# Patient Record
Sex: Female | Born: 1970 | Race: White | Hispanic: No | Marital: Married | Smoking: Never smoker
Health system: Southern US, Community
[De-identification: ages and names within clinical notes are randomized; demographics above are authoritative.]

## PROBLEM LIST (undated history)

## (undated) DIAGNOSIS — F419 Anxiety disorder, unspecified: Secondary | ICD-10-CM

## (undated) DIAGNOSIS — Z789 Other specified health status: Secondary | ICD-10-CM

## (undated) DIAGNOSIS — I8393 Asymptomatic varicose veins of bilateral lower extremities: Secondary | ICD-10-CM

## (undated) DIAGNOSIS — I10 Essential (primary) hypertension: Secondary | ICD-10-CM

## (undated) HISTORY — DX: Asymptomatic varicose veins of bilateral lower extremities: I83.93

## (undated) HISTORY — PX: TONSILLECTOMY: SUR1361

## (undated) HISTORY — DX: Other specified health status: Z78.9

---

## 2004-08-13 ENCOUNTER — Ambulatory Visit: Payer: Self-pay | Admitting: General Surgery

## 2009-01-15 ENCOUNTER — Ambulatory Visit: Payer: Self-pay | Admitting: Neurology

## 2009-07-13 ENCOUNTER — Ambulatory Visit: Payer: Self-pay | Admitting: Neurology

## 2011-11-03 ENCOUNTER — Ambulatory Visit: Payer: Self-pay | Admitting: Internal Medicine

## 2012-01-31 ENCOUNTER — Emergency Department: Payer: Self-pay | Admitting: Internal Medicine

## 2012-01-31 LAB — BASIC METABOLIC PANEL
Anion Gap: 7 (ref 7–16)
BUN: 17 mg/dL (ref 7–18)
Calcium, Total: 8.8 mg/dL (ref 8.5–10.1)
Chloride: 104 mmol/L (ref 98–107)
Co2: 27 mmol/L (ref 21–32)
EGFR (Non-African Amer.): >60
Glucose: 92 mg/dL (ref 65–99)
Potassium: 3.8 mmol/L (ref 3.5–5.1)

## 2012-01-31 LAB — URINALYSIS, COMPLETE
Bacteria: NONE SEEN
Bilirubin,UR: NEGATIVE
Glucose,UR: NEGATIVE mg/dL (ref 0–75)
Leukocyte Esterase: NEGATIVE
Nitrite: NEGATIVE
Ph: 8 (ref 4.5–8.0)
Specific Gravity: 1.018 (ref 1.003–1.030)
WBC UR: <1 /HPF (ref 0–5)

## 2012-01-31 LAB — CBC
MCV: 95 fL (ref 80–100)
RBC: 4.53 10*6/uL (ref 3.80–5.20)
RDW: 12 % (ref 11.5–14.5)

## 2012-01-31 LAB — PREGNANCY, URINE: Pregnancy Test, Urine: NEGATIVE m[IU]/mL

## 2013-09-12 ENCOUNTER — Ambulatory Visit: Payer: Self-pay | Admitting: Rheumatology

## 2017-10-15 ENCOUNTER — Ambulatory Visit (INDEPENDENT_AMBULATORY_CARE_PROVIDER_SITE_OTHER): Payer: Managed Care, Other (non HMO) | Admitting: Vascular Surgery

## 2017-10-15 ENCOUNTER — Encounter (INDEPENDENT_AMBULATORY_CARE_PROVIDER_SITE_OTHER): Payer: Self-pay | Admitting: Vascular Surgery

## 2017-10-15 VITALS — BP 150/94 | HR 68 | Resp 17 | Ht 63.0 in | Wt 163.6 lb

## 2017-10-15 DIAGNOSIS — R600 Localized edema: Secondary | ICD-10-CM

## 2017-10-15 DIAGNOSIS — I83813 Varicose veins of bilateral lower extremities with pain: Secondary | ICD-10-CM | POA: Diagnosis not present

## 2017-10-15 NOTE — Progress Notes (Signed)
Subjective:    Patient ID: Chelsea Greene, female    DOB: 1971-05-22, 47 y.o.   MRN: [id number] Chief Complaint  Patient presents with  . New Patient (Initial Visit)    ref self for bil leg soreness   Presents as a new patient self-referred for evaluation of painful varicose veins.  The patient notes a month of progressively worsening pain along a large varicosity noted to the back both of her legs.  The patient endorses a history of worsening pain and tenderness to these veins especially with prolonged activity, sitting and standing for long periods of time and towards the end of the day.  The patient feels her symptoms have progressed to the point she is unable to function on a daily basis which prompted her to seek medical attention.  The patient denies any recent surgery or trauma to the bilateral lower extremity.  At this time, the patient does not engage in conservative therapy including wearing medical grade one compression stockings or elevating her legs on a daily basis.  The patient denies any claudication-like symptoms, rest pain or ulceration to the bilateral lower extremity.  The patient describes her discomfort as a throbbing and aching along the varicosities.  The patient denies any fever, nausea or vomiting.   Review of Systems  Constitutional: Negative.   HENT: Negative.   Eyes: Negative.   Respiratory: Negative.   Cardiovascular: Positive for leg swelling.       Painful varicose veins  Gastrointestinal: Negative.   Endocrine: Negative.   Genitourinary: Negative.   Musculoskeletal: Negative.   Skin: Negative.   Allergic/Immunologic: Negative.   Neurological: Negative.   Hematological: Negative.   Psychiatric/Behavioral: Negative.       Objective:   Physical Exam  Constitutional: She is oriented to person, place, and time. She appears well-developed and well-nourished. No distress.  HENT:  Head: Normocephalic and atraumatic.  Eyes: Conjunctivae are normal. Pupils  are equal, round, and reactive to light.  Neck: Normal range of motion.  Cardiovascular: Normal rate, normal heart sounds and intact distal pulses.  Pulses:      Radial pulses are 2+ on the right side, and 2+ on the left side.       Dorsalis pedis pulses are 2+ on the right side, and 2+ on the left side.       Posterior tibial pulses are 2+ on the right side, and 2+ on the left side.  Pulmonary/Chest: Effort normal and breath sounds normal.  Musculoskeletal: Normal range of motion. She exhibits no edema (No edema is noted to the bilateral lower extremity).  Neurological: She is alert and oriented to person, place, and time.  Skin: She is not diaphoretic.  Scattered less than 1 cm varicosities noted to the bilateral lower extremity.  There is no skin changes, stasis dermatitis, cellulitis or ulceration noted to the bilateral lower extremity  Psychiatric: She has a normal mood and affect. Her behavior is normal. Judgment and thought content normal.  Vitals reviewed.  BP (!) 150/94 (BP Location: Right Arm)   Pulse 68   Resp 17   Ht 5\' 3"  (1.6 m)   Wt 163 lb 9.6 oz (74.2 kg)   BMI 28.98 kg/m   Past Medical History:  Diagnosis Date  . Medical history non-contributory    Social History   Socioeconomic History  . Marital status: Married    Spouse name: Not on file  . Number of children: Not on file  . Years of education:  Not on file  . Highest education level: Not on file  Social Needs  . Financial resource strain: Not on file  . Food insecurity - worry: Not on file  . Food insecurity - inability: Not on file  . Transportation needs - medical: Not on file  . Transportation needs - non-medical: Not on file  Occupational History  . Not on file  Tobacco Use  . Smoking status: Never Smoker  . Smokeless tobacco: Never Used  Substance and Sexual Activity  . Alcohol use: Yes    Comment: ocassionally  . Drug use: No  . Sexual activity: Not on file  Other Topics Concern  . Not on  file  Social History Narrative  . Not on file   Past Surgical History:  Procedure Laterality Date  . TONSILLECTOMY     Family History  Problem Relation Age of Onset  . Heart attack Mother   . Aneurysm Sister    No Known Allergies     Assessment & Plan:  Presents as a new patient self-referred for evaluation of painful varicose veins.  The patient notes a month of progressively worsening pain along a large varicosity noted to the back both of her legs.  The patient endorses a history of worsening pain and tenderness to these veins especially with prolonged activity, sitting and standing for long periods of time and towards the end of the day.  The patient feels her symptoms have progressed to the point she is unable to function on a daily basis which prompted her to seek medical attention.  The patient denies any recent surgery or trauma to the bilateral lower extremity.  At this time, the patient does not engage in conservative therapy including wearing medical grade one compression stockings or elevating her legs on a daily basis.  The patient denies any claudication-like symptoms, rest pain or ulceration to the bilateral lower extremity.  The patient describes her discomfort as a throbbing and aching along the varicosities.  The patient denies any fever, nausea or vomiting.  1. Varicose veins of both lower extremities with pain - New The patient was encouraged to wear graduated compression stockings (20-30 mmHg) on a daily basis. The patient was instructed to begin wearing the stockings first thing in the morning and removing them in the evening. The patient was instructed specifically not to sleep in the stockings. Prescription given In addition, behavioral modification including elevation during the day will be initiated. Anti-inflammatories for pain. The patient will follow up in one months to asses conservative management.  Information on chronic venous insufficiency and compression  stockings was given to the patient. The patient was instructed to call the office in the interim if any worsening edema or ulcerations to the legs, feet or toes occurs. The patient expresses their understanding.  - VAS [place] LOWER EXTREMITY VENOUS REFLUX; Future  2. Bilateral lower extremity edema - New As above  - VAS [place] LOWER EXTREMITY VENOUS REFLUX; Future  Current Outpatient Medications on File Prior to Visit  Medication Sig Dispense Refill  . ARMOUR THYROID 60 MG tablet     . MIMVEY 1-0.5 MG tablet TAKE 1 TABLET(S) EVERY DAY BY ORAL ROUTE.  12  . vitamin B-12 (CYANOCOBALAMIN) 1000 MCG tablet Take by mouth.     No current facility-administered medications on file prior to visit.    There are no Patient Instructions on file for this visit. No Follow-up on file.  KIMBERLY A STEGMAYER, PA-C

## 2017-11-21 ENCOUNTER — Ambulatory Visit (INDEPENDENT_AMBULATORY_CARE_PROVIDER_SITE_OTHER): Payer: Managed Care, Other (non HMO) | Admitting: Vascular Surgery

## 2017-11-21 ENCOUNTER — Ambulatory Visit (INDEPENDENT_AMBULATORY_CARE_PROVIDER_SITE_OTHER): Payer: Managed Care, Other (non HMO)

## 2017-11-21 ENCOUNTER — Encounter (INDEPENDENT_AMBULATORY_CARE_PROVIDER_SITE_OTHER): Payer: Self-pay | Admitting: Vascular Surgery

## 2017-11-21 VITALS — BP 137/88 | HR 78 | Resp 16 | Ht 63.0 in | Wt 150.6 lb

## 2017-11-21 DIAGNOSIS — M79605 Pain in left leg: Secondary | ICD-10-CM | POA: Diagnosis not present

## 2017-11-21 DIAGNOSIS — I83813 Varicose veins of bilateral lower extremities with pain: Secondary | ICD-10-CM | POA: Diagnosis not present

## 2017-11-21 DIAGNOSIS — M79604 Pain in right leg: Secondary | ICD-10-CM | POA: Diagnosis not present

## 2017-11-21 DIAGNOSIS — R600 Localized edema: Secondary | ICD-10-CM | POA: Diagnosis not present

## 2017-11-21 DIAGNOSIS — I890 Lymphedema, not elsewhere classified: Secondary | ICD-10-CM

## 2017-11-21 NOTE — Progress Notes (Signed)
Subjective:    Patient ID: Chelsea Greene, female    DOB: January 29, 1971, 47 y.o.   MRN: [id number] Chief Complaint  Patient presents with  . Follow-up    pt conv bil ven reflux   The patient presents to review vascular studies.  The patient was last seen on October 15, 2017 for evaluation of bilateral lower extremity pain and varicose veins.  The patient continues to experience pain along the back of her thighs which radiates from her buttocks towards her knees.  The patient notes that this area is very sensitive to touch.  The patient does have cervical spine disease.  The patient has been engaging in conservative therapy including wearing medical grade 1 compression socks, elevating her legs and remaining active with minimal improvement to her symptoms.  The patient feels that her symptoms have progressed to the point that she is unable to function on a daily basis.  The patient notes that her symptoms have become lifestyle limiting.  The patient notes there is pain along the varicosities noted to the bilateral lower extremity.  The patient underwent a bilateral lower extremity reflux study which was notable for no evidence of chronic venous insufficiency to the bilateral lower extremity.  No evidence of deep vein or superficial venous thrombosis.  The patient denies any fever, nausea vomiting.  Review of Systems  Constitutional: Negative.   HENT: Negative.   Eyes: Negative.   Respiratory: Negative.   Cardiovascular: Positive for leg swelling.       Painful varicose veins  Gastrointestinal: Negative.   Endocrine: Negative.   Genitourinary: Negative.   Musculoskeletal: Negative.   Skin: Negative.   Allergic/Immunologic: Negative.   Neurological: Negative.   Hematological: Negative.   Psychiatric/Behavioral: Negative.       Objective:   Physical Exam  Constitutional: She is oriented to person, place, and time. She appears well-developed and well-nourished. No distress.  HENT:  Head:  Normocephalic and atraumatic.  Right Ear: External ear normal.  Left Ear: External ear normal.  Eyes: Pupils are equal, round, and reactive to light. Conjunctivae and EOM are normal.  Neck: Normal range of motion.  Cardiovascular: Normal rate, regular rhythm, normal heart sounds and intact distal pulses.  Pulses:      Radial pulses are 2+ on the right side, and 2+ on the left side.       Dorsalis pedis pulses are 2+ on the right side, and 2+ on the left side.       Posterior tibial pulses are 2+ on the right side, and 2+ on the left side.  Pulmonary/Chest: Effort normal.  Musculoskeletal: Normal range of motion. She exhibits edema (Mild lower extremity edema noted).  Neurological: She is alert and oriented to person, place, and time.  Skin: Skin is warm and dry. She is not diaphoretic.  Greater than 1 cm and less than 1 cm scattered varicosities noted to the bilateral lower extremity.  There is no stasis dermatitis, skin thickening, cellulitis or ulceration noted to the bilateral lower extremity.  Psychiatric: She has a normal mood and affect. Her behavior is normal. Judgment and thought content normal.  Vitals reviewed.  BP 137/88 (BP Location: Right Arm)   Pulse 78   Resp 16   Ht 5\' 3"  (1.6 m)   Wt 150 lb 9.6 oz (68.3 kg)   BMI 26.68 kg/m   Past Medical History:  Diagnosis Date  . Medical history non-contributory    Social History   Socioeconomic History  .  Marital status: Married    Spouse name: Not on file  . Number of children: Not on file  . Years of education: Not on file  . Highest education level: Not on file  Occupational History  . Not on file  Social Needs  . Financial resource strain: Not on file  . Food insecurity:    Worry: Not on file    Inability: Not on file  . Transportation needs:    Medical: Not on file    Non-medical: Not on file  Tobacco Use  . Smoking status: Never Smoker  . Smokeless tobacco: Never Used  Substance and Sexual Activity  .  Alcohol use: Yes    Comment: ocassionally  . Drug use: No  . Sexual activity: Not on file  Lifestyle  . Physical activity:    Days per week: Not on file    Minutes per session: Not on file  . Stress: Not on file  Relationships  . Social connections:    Talks on phone: Not on file    Gets together: Not on file    Attends religious service: Not on file    Active member of club or organization: Not on file    Attends meetings of clubs or organizations: Not on file    Relationship status: Not on file  . Intimate partner violence:    Fear of current or ex partner: Not on file    Emotionally abused: Not on file    Physically abused: Not on file    Forced sexual activity: Not on file  Other Topics Concern  . Not on file  Social History Narrative  . Not on file   Past Surgical History:  Procedure Laterality Date  . TONSILLECTOMY     Family History  Problem Relation Age of Onset  . Heart attack Mother   . Aneurysm Sister    No Known Allergies     Assessment & Plan:  The patient presents to review vascular studies.  The patient was last seen on October 15, 2017 for evaluation of bilateral lower extremity pain and varicose veins.  The patient continues to experience pain along the back of her thighs which radiates from her buttocks towards her knees.  The patient notes that this area is very sensitive to touch.  The patient does have cervical spine disease.  The patient has been engaging in conservative therapy including wearing medical grade 1 compression socks, elevating her legs and remaining active with minimal improvement to her symptoms.  The patient feels that her symptoms have progressed to the point that she is unable to function on a daily basis.  The patient notes that her symptoms have become lifestyle limiting.  The patient notes there is pain along the varicosities noted to the bilateral lower extremity.  The patient underwent a bilateral lower extremity reflux study which  was notable for no evidence of chronic venous insufficiency to the bilateral lower extremity.  No evidence of deep vein or superficial venous thrombosis.  The patient denies any fever, nausea vomiting.  1. Lower extremity pain, bilateral - Stable Recent without any venous reflux noted on duplex today Patient does have a history of cervical spine disease Recommend a x-ray of the patient's lumbar / sacral spine to rule out any DDD that may be contributing to the patient's lower extremity discomfort.  2. Varicose veins of both lower extremities with pain - Stable The patient would benefit from saline sclerotherapy into the painful varicosities noted to  the bilateral lower extremity The patient has been engaging in conservative therapy including wearing medical grade 1 compression socks, elevating her legs and remaining active with minimal improvement to her symptoms. The patient's symptoms have not progressed to the point that she is unable to function on a daily basis and they have become lifestyle limiting I will applied to the patient's insurance for saline sclerotherapy to the bilateral lower extremity In the meantime, the patient will continue engaging in conservative therapy  3. Lymphedema - New Despite conservative treatments including exercise, elevation and class I compression stockings the patient still presents with stage I lymphedema The patient may be a candidate for a lymphedema pump in the future  Current Outpatient Medications on File Prior to Visit  Medication Sig Dispense Refill  . ARMOUR THYROID 60 MG tablet     . MIMVEY 1-0.5 MG tablet TAKE 1 TABLET(S) EVERY DAY BY ORAL ROUTE.  12  . vitamin B-12 (CYANOCOBALAMIN) 1000 MCG tablet Take by mouth.     No current facility-administered medications on file prior to visit.    There are no Patient Instructions on file for this visit. No follow-ups on file.  Selso Mannor A Rhylei Mcquaig, PA-C

## 2018-01-01 ENCOUNTER — Ambulatory Visit (INDEPENDENT_AMBULATORY_CARE_PROVIDER_SITE_OTHER): Payer: Managed Care, Other (non HMO) | Admitting: Vascular Surgery

## 2018-01-01 ENCOUNTER — Encounter (INDEPENDENT_AMBULATORY_CARE_PROVIDER_SITE_OTHER): Payer: Self-pay | Admitting: Vascular Surgery

## 2018-01-01 VITALS — BP 157/92 | HR 66 | Resp 13 | Ht 64.0 in | Wt 147.0 lb

## 2018-01-01 DIAGNOSIS — I83813 Varicose veins of bilateral lower extremities with pain: Secondary | ICD-10-CM

## 2018-01-01 DIAGNOSIS — I83811 Varicose veins of right lower extremities with pain: Secondary | ICD-10-CM

## 2018-01-01 NOTE — Progress Notes (Signed)
Varicose veins of right  lower extremity with inflammation (454.1  I83.10) Current Plans   Indication: Patient presents with symptomatic varicose veins of the right  lower extremity.   Procedure: Sclerotherapy using hypertonic saline mixed with 1% Lidocaine was performed on the right lower extremity. Compression wraps were placed. The patient tolerated the procedure well. 

## 2018-01-14 ENCOUNTER — Encounter (INDEPENDENT_AMBULATORY_CARE_PROVIDER_SITE_OTHER): Payer: Self-pay | Admitting: Vascular Surgery

## 2018-02-04 ENCOUNTER — Ambulatory Visit (INDEPENDENT_AMBULATORY_CARE_PROVIDER_SITE_OTHER): Payer: Managed Care, Other (non HMO) | Admitting: Vascular Surgery

## 2018-02-04 ENCOUNTER — Encounter (INDEPENDENT_AMBULATORY_CARE_PROVIDER_SITE_OTHER): Payer: Self-pay | Admitting: Vascular Surgery

## 2018-02-04 VITALS — BP 147/92 | HR 80 | Resp 17 | Ht 64.0 in | Wt 154.6 lb

## 2018-02-04 DIAGNOSIS — I83813 Varicose veins of bilateral lower extremities with pain: Secondary | ICD-10-CM

## 2018-02-04 DIAGNOSIS — I83811 Varicose veins of right lower extremities with pain: Secondary | ICD-10-CM

## 2018-02-04 DIAGNOSIS — I83812 Varicose veins of left lower extremities with pain: Secondary | ICD-10-CM

## 2018-02-04 NOTE — Progress Notes (Signed)
Varicose veins of bilateral  lower extremity with inflammation (454.1  I83.10) Current Plans   Indication: Patient presents with symptomatic varicose veins of the bilateral  lower extremity.   Procedure: Sclerotherapy using hypertonic saline mixed with 1% Lidocaine was performed on the bilateral lower extremity. Compression wraps were placed. The patient tolerated the procedure well. 

## 2020-04-01 LAB — EXTERNAL GENERIC LAB PROCEDURE: COLOGUARD: NEGATIVE

## 2020-04-13 ENCOUNTER — Other Ambulatory Visit: Payer: Self-pay

## 2020-04-13 ENCOUNTER — Encounter: Payer: Self-pay | Admitting: Emergency Medicine

## 2020-04-13 ENCOUNTER — Ambulatory Visit
Admission: EM | Admit: 2020-04-13 | Discharge: 2020-04-13 | Disposition: A | Discharge status: Home / Self Care | Payer: Managed Care, Other (non HMO) | Attending: Family Medicine | Admitting: Family Medicine

## 2020-04-13 ENCOUNTER — Ambulatory Visit (INDEPENDENT_AMBULATORY_CARE_PROVIDER_SITE_OTHER)
Admit: 2020-04-13 | Discharge: 2020-04-13 | Disposition: A | Discharge status: Home / Self Care | Payer: Managed Care, Other (non HMO)

## 2020-04-13 ENCOUNTER — Ambulatory Visit (INDEPENDENT_AMBULATORY_CARE_PROVIDER_SITE_OTHER): Payer: Managed Care, Other (non HMO)

## 2020-04-13 DIAGNOSIS — M79641 Pain in right hand: Secondary | ICD-10-CM | POA: Diagnosis not present

## 2020-04-13 DIAGNOSIS — S0083XA Contusion of other part of head, initial encounter: Secondary | ICD-10-CM

## 2020-04-13 DIAGNOSIS — S63601A Unspecified sprain of right thumb, initial encounter: Secondary | ICD-10-CM

## 2020-04-13 HISTORY — DX: Anxiety disorder, unspecified: F41.9

## 2020-04-13 HISTORY — DX: Essential (primary) hypertension: I10

## 2020-04-13 MED ORDER — BACLOFEN 10 MG PO TABS: 10.0000 mg | ORAL_TABLET | Freq: Two times a day (BID) | ORAL | 0 refills | Status: DC
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
## 2020-05-14 ENCOUNTER — Other Ambulatory Visit: Payer: Self-pay | Admitting: Orthopedic Surgery

## 2020-05-14 DIAGNOSIS — S63641D Sprain of metacarpophalangeal joint of right thumb, subsequent encounter: Secondary | ICD-10-CM

## 2020-05-31 ENCOUNTER — Other Ambulatory Visit: Payer: Self-pay

## 2020-05-31 ENCOUNTER — Ambulatory Visit
Admission: RE | Admit: 2020-05-31 | Discharge: 2020-05-31 | Disposition: A | Discharge status: Home / Self Care | Payer: Managed Care, Other (non HMO) | Source: Ambulatory Visit | Attending: Orthopedic Surgery | Admitting: Orthopedic Surgery

## 2020-05-31 DIAGNOSIS — S63641D Sprain of metacarpophalangeal joint of right thumb, subsequent encounter: Secondary | ICD-10-CM | POA: Diagnosis present

## 2020-06-14 ENCOUNTER — Encounter: Payer: Self-pay | Admitting: Occupational Therapy

## 2020-06-14 ENCOUNTER — Ambulatory Visit: Payer: Managed Care, Other (non HMO) | Attending: Orthopedic Surgery | Admitting: Occupational Therapy

## 2020-06-14 ENCOUNTER — Other Ambulatory Visit: Payer: Self-pay

## 2020-06-14 DIAGNOSIS — M6281 Muscle weakness (generalized): Secondary | ICD-10-CM | POA: Insufficient documentation

## 2020-06-14 DIAGNOSIS — R600 Localized edema: Secondary | ICD-10-CM | POA: Diagnosis present

## 2020-06-14 DIAGNOSIS — M25649 Stiffness of unspecified hand, not elsewhere classified: Secondary | ICD-10-CM | POA: Diagnosis present

## 2020-06-14 DIAGNOSIS — M79644 Pain in right finger(s): Secondary | ICD-10-CM | POA: Insufficient documentation
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
## 2020-06-14 NOTE — Patient Instructions (Signed)
Moist heat - 3-4 x day Soft tissue massage to volar thumb over IP and MC  AROM - thumb PA and RA 10 reps Blocked AROM for thumb IP , MC  Opposition - pick up 1 cm object - alternate digits- 5-8 reps  Enlarge handles to avoid tight and pinch grip And use CMC neoprene splint 2 hrs on and off during day -for some compression and support on thumb  Pain free - only slight pull - less than 2/10  No clicking with IP flexion

## 2020-06-18 ENCOUNTER — Other Ambulatory Visit: Payer: Self-pay

## 2020-06-18 ENCOUNTER — Ambulatory Visit: Payer: Managed Care, Other (non HMO) | Admitting: Occupational Therapy

## 2020-06-18 DIAGNOSIS — M25649 Stiffness of unspecified hand, not elsewhere classified: Secondary | ICD-10-CM

## 2020-06-18 DIAGNOSIS — M79644 Pain in right finger(s): Secondary | ICD-10-CM

## 2020-06-18 DIAGNOSIS — M6281 Muscle weakness (generalized): Secondary | ICD-10-CM

## 2020-06-18 DIAGNOSIS — R600 Localized edema: Secondary | ICD-10-CM
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
## 2020-06-24 ENCOUNTER — Other Ambulatory Visit: Payer: Self-pay

## 2020-06-24 ENCOUNTER — Ambulatory Visit: Payer: Managed Care, Other (non HMO) | Admitting: Occupational Therapy

## 2020-06-24 DIAGNOSIS — M25649 Stiffness of unspecified hand, not elsewhere classified: Secondary | ICD-10-CM | POA: Diagnosis not present

## 2020-06-24 DIAGNOSIS — M79644 Pain in right finger(s): Secondary | ICD-10-CM

## 2020-06-24 DIAGNOSIS — M6281 Muscle weakness (generalized): Secondary | ICD-10-CM

## 2020-06-24 DIAGNOSIS — R600 Localized edema: Secondary | ICD-10-CM
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
## 2020-06-28 ENCOUNTER — Other Ambulatory Visit: Payer: Self-pay

## 2020-06-28 ENCOUNTER — Ambulatory Visit: Payer: Managed Care, Other (non HMO) | Admitting: Occupational Therapy

## 2020-06-28 DIAGNOSIS — M79644 Pain in right finger(s): Secondary | ICD-10-CM

## 2020-06-28 DIAGNOSIS — M6281 Muscle weakness (generalized): Secondary | ICD-10-CM

## 2020-06-28 DIAGNOSIS — M25649 Stiffness of unspecified hand, not elsewhere classified: Secondary | ICD-10-CM

## 2020-06-28 DIAGNOSIS — R600 Localized edema: Secondary | ICD-10-CM
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
## 2020-07-06 ENCOUNTER — Ambulatory Visit: Payer: Managed Care, Other (non HMO) | Admitting: Occupational Therapy

## 2020-07-06 ENCOUNTER — Other Ambulatory Visit: Payer: Self-pay

## 2020-07-06 DIAGNOSIS — M79644 Pain in right finger(s): Secondary | ICD-10-CM

## 2020-07-06 DIAGNOSIS — M25649 Stiffness of unspecified hand, not elsewhere classified: Secondary | ICD-10-CM

## 2020-07-06 DIAGNOSIS — R600 Localized edema: Secondary | ICD-10-CM

## 2020-07-06 DIAGNOSIS — M6281 Muscle weakness (generalized): Secondary | ICD-10-CM
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
## 2020-07-08 ENCOUNTER — Ambulatory Visit: Payer: Managed Care, Other (non HMO) | Attending: Orthopedic Surgery | Admitting: Occupational Therapy

## 2020-07-08 ENCOUNTER — Other Ambulatory Visit: Payer: Self-pay

## 2020-07-08 DIAGNOSIS — M25649 Stiffness of unspecified hand, not elsewhere classified: Secondary | ICD-10-CM | POA: Insufficient documentation

## 2020-07-08 DIAGNOSIS — M6281 Muscle weakness (generalized): Secondary | ICD-10-CM | POA: Diagnosis present

## 2020-07-08 DIAGNOSIS — R600 Localized edema: Secondary | ICD-10-CM | POA: Diagnosis present

## 2020-07-08 DIAGNOSIS — M79644 Pain in right finger(s): Secondary | ICD-10-CM
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
## 2020-07-13 ENCOUNTER — Other Ambulatory Visit: Payer: Self-pay

## 2020-07-13 ENCOUNTER — Ambulatory Visit: Payer: Managed Care, Other (non HMO) | Admitting: Occupational Therapy

## 2020-07-13 DIAGNOSIS — R600 Localized edema: Secondary | ICD-10-CM

## 2020-07-13 DIAGNOSIS — M6281 Muscle weakness (generalized): Secondary | ICD-10-CM

## 2020-07-13 DIAGNOSIS — M79644 Pain in right finger(s): Secondary | ICD-10-CM

## 2020-07-13 DIAGNOSIS — M25649 Stiffness of unspecified hand, not elsewhere classified: Secondary | ICD-10-CM
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
## 2020-07-15 ENCOUNTER — Ambulatory Visit: Payer: Managed Care, Other (non HMO) | Admitting: Occupational Therapy

## 2020-07-15 ENCOUNTER — Other Ambulatory Visit: Payer: Self-pay

## 2020-07-15 DIAGNOSIS — M25649 Stiffness of unspecified hand, not elsewhere classified: Secondary | ICD-10-CM

## 2020-07-15 DIAGNOSIS — R600 Localized edema: Secondary | ICD-10-CM

## 2020-07-15 DIAGNOSIS — M6281 Muscle weakness (generalized): Secondary | ICD-10-CM

## 2020-07-15 DIAGNOSIS — M79644 Pain in right finger(s): Secondary | ICD-10-CM
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
## 2020-07-20 ENCOUNTER — Other Ambulatory Visit: Payer: Self-pay

## 2020-07-20 ENCOUNTER — Ambulatory Visit: Payer: Managed Care, Other (non HMO) | Admitting: Occupational Therapy

## 2020-07-20 DIAGNOSIS — R600 Localized edema: Secondary | ICD-10-CM

## 2020-07-20 DIAGNOSIS — M25649 Stiffness of unspecified hand, not elsewhere classified: Secondary | ICD-10-CM | POA: Diagnosis not present

## 2020-07-20 DIAGNOSIS — M79644 Pain in right finger(s): Secondary | ICD-10-CM

## 2020-07-20 DIAGNOSIS — M6281 Muscle weakness (generalized): Secondary | ICD-10-CM
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
## 2020-07-22 ENCOUNTER — Encounter: Payer: Managed Care, Other (non HMO) | Admitting: Occupational Therapy

## 2020-08-03 ENCOUNTER — Other Ambulatory Visit: Payer: Self-pay

## 2020-08-03 ENCOUNTER — Ambulatory Visit: Payer: Managed Care, Other (non HMO) | Admitting: Occupational Therapy

## 2020-08-03 DIAGNOSIS — M6281 Muscle weakness (generalized): Secondary | ICD-10-CM

## 2020-08-03 DIAGNOSIS — M25649 Stiffness of unspecified hand, not elsewhere classified: Secondary | ICD-10-CM | POA: Diagnosis not present

## 2020-08-03 DIAGNOSIS — M79644 Pain in right finger(s): Secondary | ICD-10-CM

## 2020-08-03 DIAGNOSIS — R600 Localized edema: Secondary | ICD-10-CM
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
## 2020-08-04 ENCOUNTER — Other Ambulatory Visit: Payer: Managed Care, Other (non HMO)

## 2020-08-05 ENCOUNTER — Ambulatory Visit: Payer: Managed Care, Other (non HMO) | Admitting: Occupational Therapy

## 2021-01-13 ENCOUNTER — Other Ambulatory Visit: Payer: Self-pay | Admitting: Physician Assistant

## 2021-01-14 ENCOUNTER — Ambulatory Visit
Admission: RE | Admit: 2021-01-14 | Discharge: 2021-01-14 | Disposition: A | Discharge status: Home / Self Care | Payer: Managed Care, Other (non HMO) | Source: Ambulatory Visit | Attending: Physician Assistant | Admitting: Physician Assistant

## 2021-01-14 ENCOUNTER — Other Ambulatory Visit: Payer: Self-pay | Admitting: Physician Assistant

## 2021-01-14 ENCOUNTER — Other Ambulatory Visit: Payer: Self-pay

## 2021-01-14 DIAGNOSIS — R2242 Localized swelling, mass and lump, left lower limb: Secondary | ICD-10-CM

## 2021-12-13 ENCOUNTER — Other Ambulatory Visit (INDEPENDENT_AMBULATORY_CARE_PROVIDER_SITE_OTHER): Payer: Self-pay | Admitting: Nurse Practitioner

## 2021-12-13 DIAGNOSIS — I8311 Varicose veins of right lower extremity with inflammation: Secondary | ICD-10-CM

## 2021-12-15 ENCOUNTER — Encounter (INDEPENDENT_AMBULATORY_CARE_PROVIDER_SITE_OTHER): Payer: Managed Care, Other (non HMO)

## 2021-12-15 ENCOUNTER — Encounter (INDEPENDENT_AMBULATORY_CARE_PROVIDER_SITE_OTHER): Payer: Managed Care, Other (non HMO) | Admitting: Vascular Surgery

## 2021-12-15 ENCOUNTER — Encounter (INDEPENDENT_AMBULATORY_CARE_PROVIDER_SITE_OTHER): Payer: Self-pay

## 2022-06-05 ENCOUNTER — Encounter (INDEPENDENT_AMBULATORY_CARE_PROVIDER_SITE_OTHER): Payer: Self-pay

## 2023-01-18 IMAGING — US US EXTREM LOW*L* LIMITED
1 series · 14 of 23 positions shown · non-contrast
Comparison: None.

CLINICAL DATA: Left groin mass.

EXAM:
ULTRASOUND LEFT LOWER EXTREMITY LIMITED
TECHNIQUE: Ultrasound examination of the lower extremity soft tissues was
performed in the area of clinical concern.

[Series 1: us left lower extrem ltd soft tissue non vascular · 23 acquisitions, 14 frames shown]
[im 1/23]
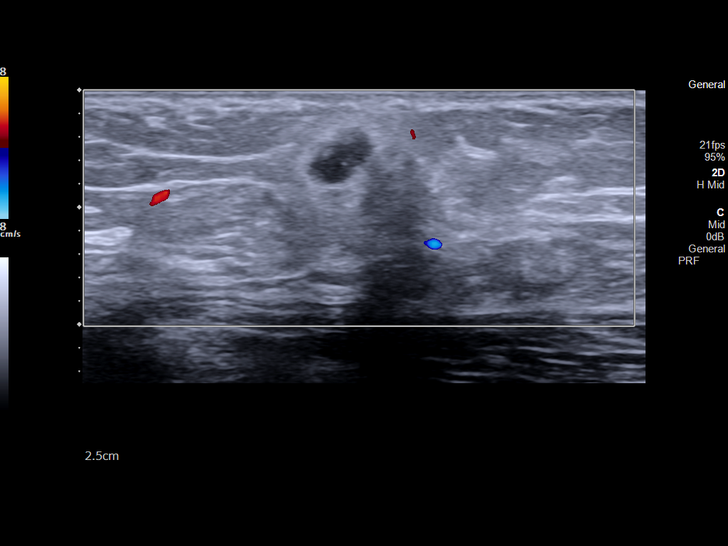
[im 3/23]
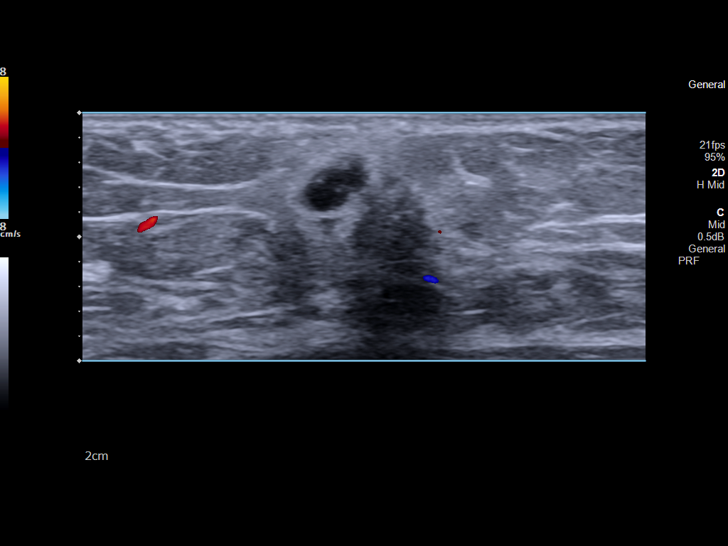
[im 5/23]
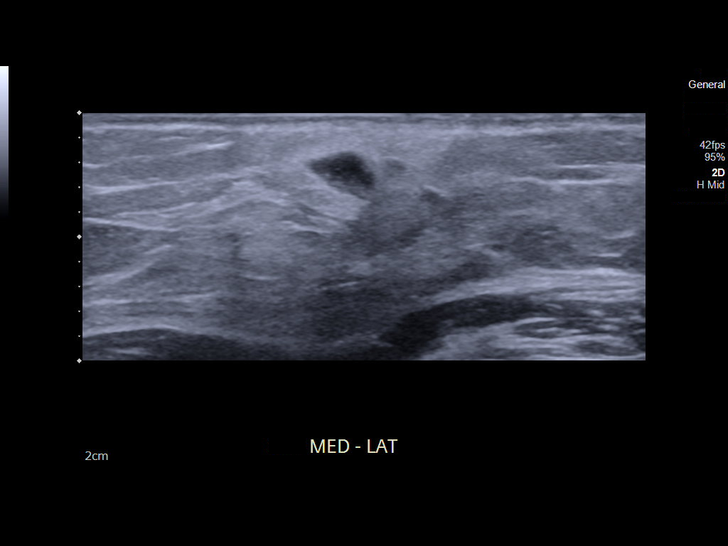
[im 6/23]
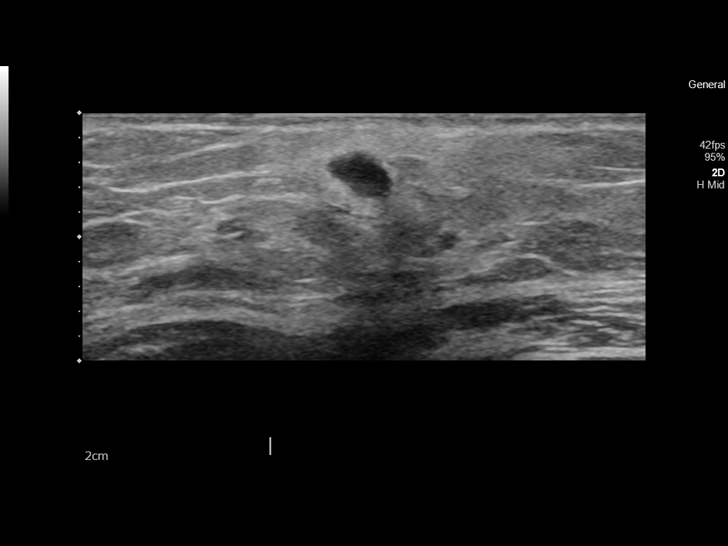
[im 8/23]
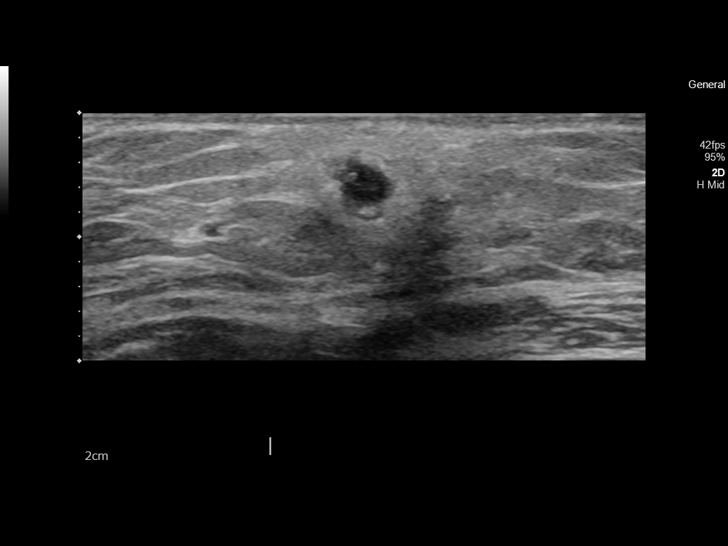
[im 10/23]
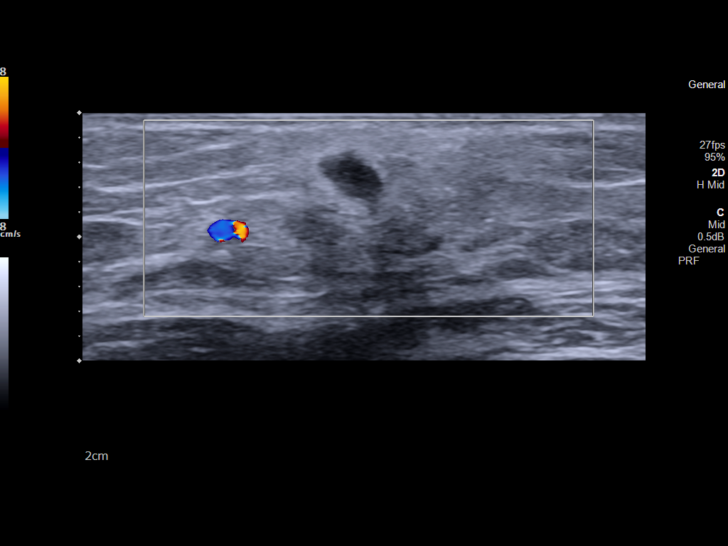
[im 11/23]
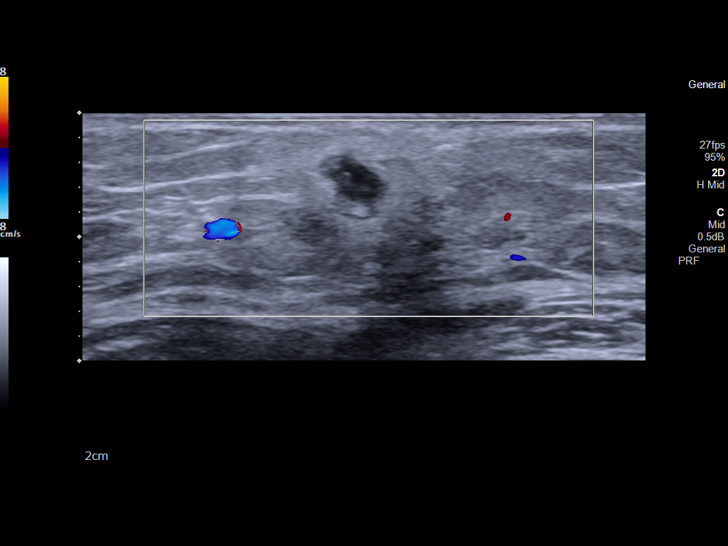
[im 13/23]
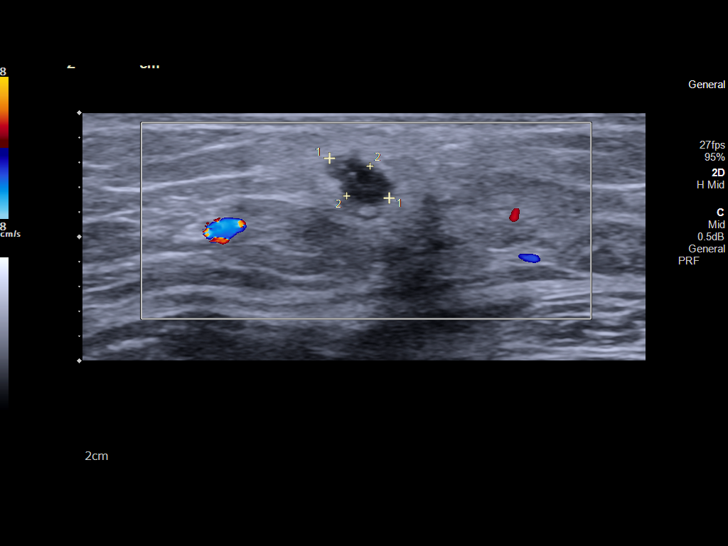
[im 14/23]
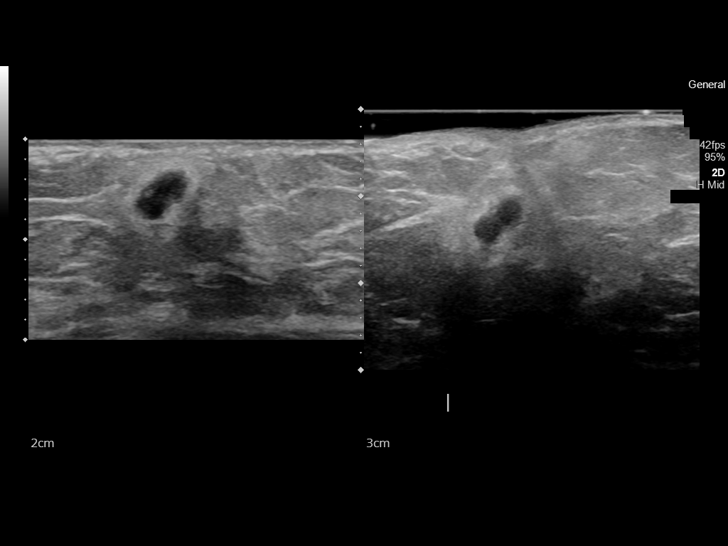
[im 16/23]
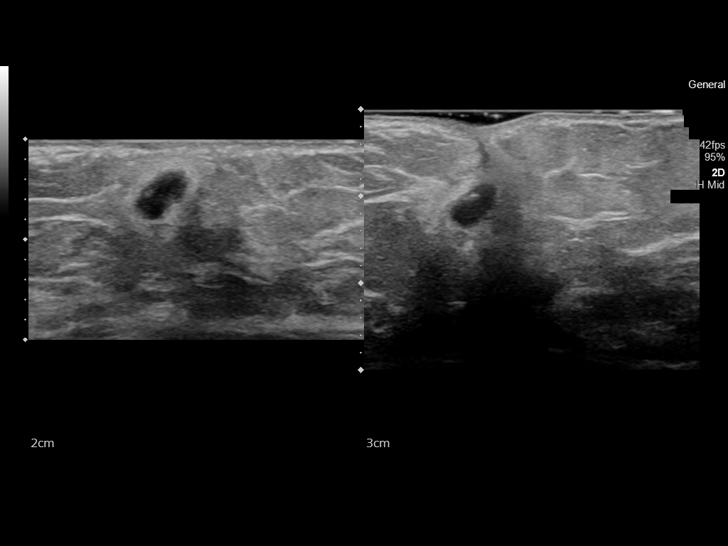
[im 18/23]
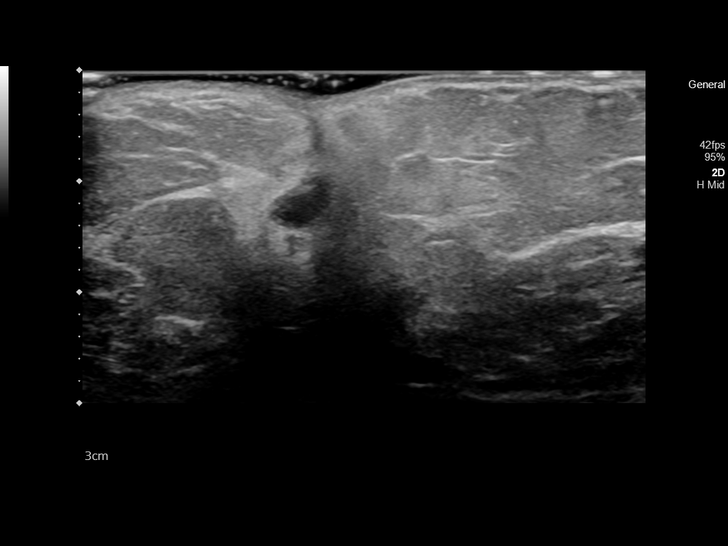
[im 19/23]
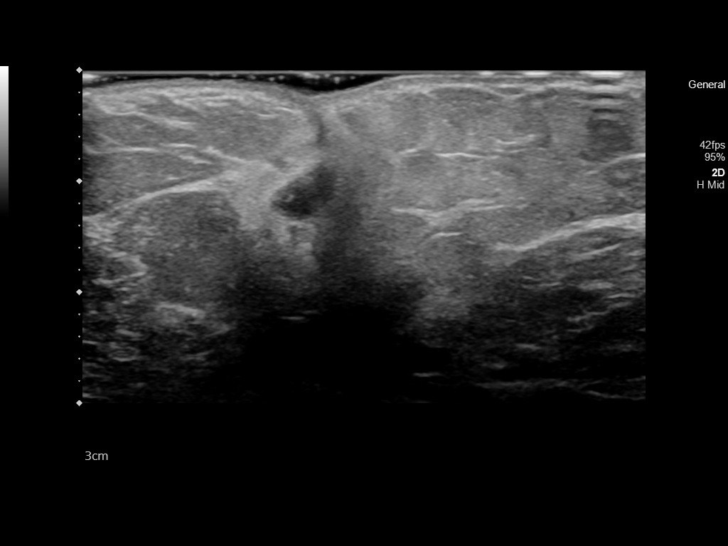
[im 21/23]
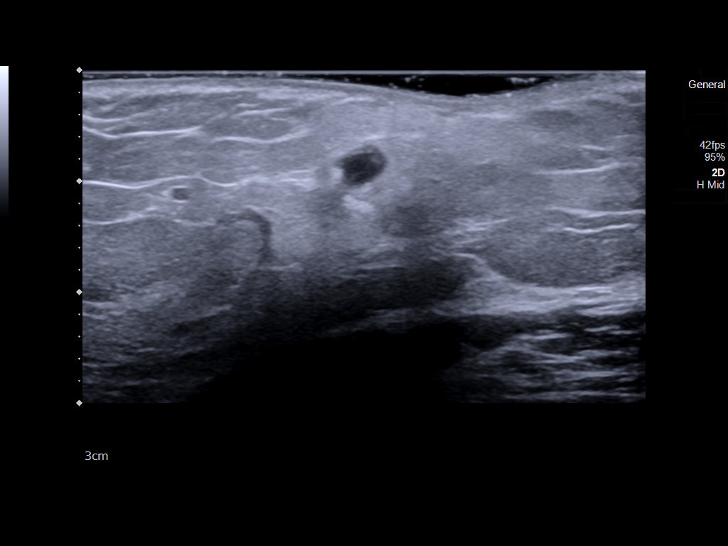
[im 23/23]
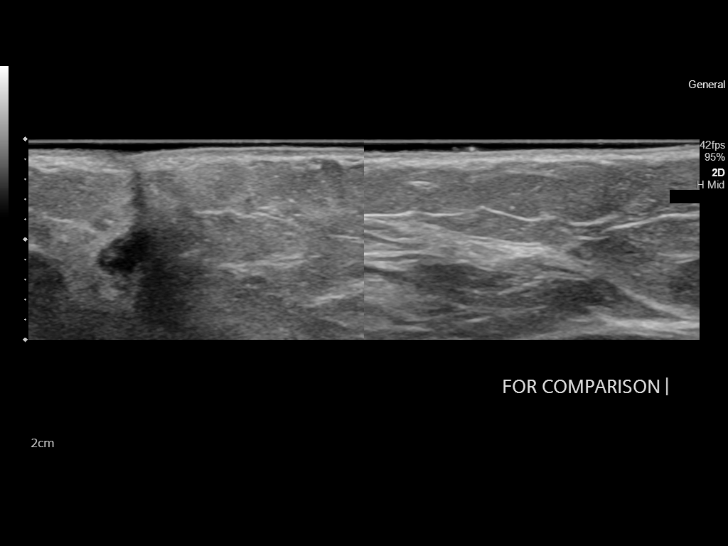

[14 of 23 positions shown; findings below may reference images not displayed]

FINDINGS: Palpable abnormality in the left groin corresponds to a superficial
5 x 6 x 3 mm heterogeneously hypoechoic lesion with indistinct
borders and no internal vascularity. There appears to be a small
sinus tract tracking to the skin surface.
IMPRESSION: 1. 6 mm complex cystic lesion in the left groin corresponding to the
patient's palpable abnormality, with small sinus tract tracking to
the skin surface. Appearance is most suggestive of a small sebaceous
cyst.

## 2023-05-04 ENCOUNTER — Other Ambulatory Visit: Payer: Self-pay | Admitting: Obstetrics and Gynecology

## 2023-05-04 DIAGNOSIS — Z1231 Encounter for screening mammogram for malignant neoplasm of breast: Secondary | ICD-10-CM

## 2023-05-22 LAB — EXTERNAL GENERIC LAB PROCEDURE: COLOGUARD: NEGATIVE

## 2023-08-14 ENCOUNTER — Emergency Department: Payer: 59

## 2023-08-14 ENCOUNTER — Inpatient Hospital Stay
Admission: EM | Admit: 2023-08-14 | Discharge: 2023-08-17 | DRG: 322 | Disposition: A | Discharge status: Home / Self Care | Payer: 59 | Attending: Internal Medicine | Admitting: Internal Medicine

## 2023-08-14 ENCOUNTER — Other Ambulatory Visit: Payer: Self-pay

## 2023-08-14 ENCOUNTER — Encounter: Payer: Self-pay | Admitting: *Deleted

## 2023-08-14 ENCOUNTER — Inpatient Hospital Stay
Admit: 2023-08-14 | Discharge: 2023-08-14 | Disposition: A | Discharge status: Home / Self Care | Payer: 59 | Attending: Internal Medicine | Admitting: Internal Medicine

## 2023-08-14 DIAGNOSIS — Z8249 Family history of ischemic heart disease and other diseases of the circulatory system: Secondary | ICD-10-CM | POA: Diagnosis not present

## 2023-08-14 DIAGNOSIS — R079 Chest pain, unspecified: Secondary | ICD-10-CM

## 2023-08-14 DIAGNOSIS — Z79899 Other long term (current) drug therapy: Secondary | ICD-10-CM | POA: Diagnosis not present

## 2023-08-14 DIAGNOSIS — I25110 Atherosclerotic heart disease of native coronary artery with unstable angina pectoris: Principal | ICD-10-CM | POA: Diagnosis present

## 2023-08-14 DIAGNOSIS — Z825 Family history of asthma and other chronic lower respiratory diseases: Secondary | ICD-10-CM | POA: Diagnosis not present

## 2023-08-14 DIAGNOSIS — I200 Unstable angina: Secondary | ICD-10-CM | POA: Diagnosis present

## 2023-08-14 DIAGNOSIS — I493 Ventricular premature depolarization: Secondary | ICD-10-CM | POA: Diagnosis present

## 2023-08-14 DIAGNOSIS — Z79890 Hormone replacement therapy (postmenopausal): Secondary | ICD-10-CM

## 2023-08-14 DIAGNOSIS — I2510 Atherosclerotic heart disease of native coronary artery without angina pectoris: Secondary | ICD-10-CM | POA: Diagnosis present

## 2023-08-14 DIAGNOSIS — E785 Hyperlipidemia, unspecified: Secondary | ICD-10-CM | POA: Diagnosis present

## 2023-08-14 DIAGNOSIS — I77810 Thoracic aortic ectasia: Secondary | ICD-10-CM | POA: Diagnosis present

## 2023-08-14 DIAGNOSIS — F32A Depression, unspecified: Secondary | ICD-10-CM | POA: Diagnosis present

## 2023-08-14 DIAGNOSIS — E039 Hypothyroidism, unspecified: Secondary | ICD-10-CM | POA: Diagnosis present

## 2023-08-14 DIAGNOSIS — I214 Non-ST elevation (NSTEMI) myocardial infarction: Secondary | ICD-10-CM

## 2023-08-14 DIAGNOSIS — Z7982 Long term (current) use of aspirin: Secondary | ICD-10-CM | POA: Diagnosis not present

## 2023-08-14 DIAGNOSIS — R739 Hyperglycemia, unspecified: Secondary | ICD-10-CM | POA: Insufficient documentation

## 2023-08-14 DIAGNOSIS — R001 Bradycardia, unspecified: Secondary | ICD-10-CM | POA: Diagnosis not present

## 2023-08-14 DIAGNOSIS — I10 Essential (primary) hypertension: Secondary | ICD-10-CM | POA: Insufficient documentation

## 2023-08-14 DIAGNOSIS — F419 Anxiety disorder, unspecified: Secondary | ICD-10-CM | POA: Diagnosis present

## 2023-08-14 LAB — BASIC METABOLIC PANEL
Anion gap: 10 (ref 5–15)
BUN: 20 mg/dL (ref 6–20)
CO2: 29 mmol/L (ref 22–32)
Calcium: 10 mg/dL (ref 8.9–10.3)
Chloride: 99 mmol/L (ref 98–111)
Creatinine, Ser: 1.02 mg/dL — ABNORMAL HIGH (ref 0.44–1.00)
GFR, Estimated: >60 mL/min (ref >60)
Glucose, Bld: 113 mg/dL — ABNORMAL HIGH (ref 70–99)
Potassium: 4.5 mmol/L (ref 3.5–5.1)
Sodium: 138 mmol/L (ref 135–145)

## 2023-08-14 LAB — TROPONIN I (HIGH SENSITIVITY)
Troponin I (High Sensitivity): 23 ng/L — ABNORMAL HIGH (ref <18)
Troponin I (High Sensitivity): 26 ng/L — ABNORMAL HIGH (ref <18)

## 2023-08-14 LAB — PROTIME-INR
INR: 1 (ref 0.8–1.2)
Prothrombin Time: 13.1 s (ref 11.4–15.2)

## 2023-08-14 LAB — CBC
HCT: 39.1 % (ref 36.0–46.0)
Hemoglobin: 12.9 g/dL (ref 12.0–15.0)
MCH: 30.2 pg (ref 26.0–34.0)
MCHC: 33 g/dL (ref 30.0–36.0)
MCV: 91.6 fL (ref 80.0–100.0)
Platelets: 319 10*3/uL (ref 150–400)
RBC: 4.27 MIL/uL (ref 3.87–5.11)
RDW: 12.7 % (ref 11.5–15.5)
WBC: 9.2 10*3/uL (ref 4.0–10.5)
nRBC: 0 % (ref 0.0–0.2)

## 2023-08-14 LAB — APTT: aPTT: 27 s (ref 24–36)

## 2023-08-14 MED ORDER — HYDROCHLOROTHIAZIDE 12.5 MG PO TABS: 12.5000 mg | ORAL_TABLET | Freq: Every day | ORAL | Status: DC

## 2023-08-14 MED ORDER — ACETAMINOPHEN 650 MG RE SUPP: 650.0000 mg | Freq: Four times a day (QID) | RECTAL | Status: DC | PRN

## 2023-08-14 MED ORDER — SENNOSIDES-DOCUSATE SODIUM 8.6-50 MG PO TABS: 1.0000 | ORAL_TABLET | Freq: Every evening | ORAL | Status: DC | PRN

## 2023-08-14 MED ORDER — HEPARIN BOLUS VIA INFUSION
4000.0000 [IU] | Freq: Once | INTRAVENOUS | Status: AC
  Administered 2023-08-14: 4000 [IU] via INTRAVENOUS
  Filled 2023-08-14: qty 4000

## 2023-08-14 MED ORDER — ESCITALOPRAM OXALATE 10 MG PO TABS: 5.0000 mg | ORAL_TABLET | Freq: Every day | ORAL | Status: DC

## 2023-08-14 MED ORDER — HYDRALAZINE HCL 20 MG/ML IJ SOLN: 5.0000 mg | Freq: Four times a day (QID) | INTRAMUSCULAR | Status: DC | PRN

## 2023-08-14 MED ORDER — MORPHINE SULFATE (PF) 2 MG/ML IV SOLN: 2.0000 mg | INTRAVENOUS | Status: AC | PRN

## 2023-08-14 MED ORDER — LEVOTHYROXINE SODIUM 88 MCG PO TABS
88.0000 ug | ORAL_TABLET | Freq: Every day | ORAL | Status: DC
  Administered 2023-08-15 – 2023-08-17 (×3): 88 ug via ORAL
  Filled 2023-08-14 (×4): qty 1

## 2023-08-14 MED ORDER — ONDANSETRON HCL 4 MG/2ML IJ SOLN: 4.0000 mg | Freq: Four times a day (QID) | INTRAMUSCULAR | Status: DC | PRN

## 2023-08-14 MED ORDER — ESTRADIOL-NORETHINDRONE ACET 1-0.5 MG PO TABS: 1.0000 | ORAL_TABLET | Freq: Every day | ORAL | Status: DC

## 2023-08-14 MED ORDER — IOHEXOL 350 MG/ML SOLN
80.0000 mL | Freq: Once | INTRAVENOUS | Status: AC | PRN
  Administered 2023-08-14: 80 mL via INTRAVENOUS

## 2023-08-14 MED ORDER — ONDANSETRON HCL 4 MG PO TABS: 4.0000 mg | ORAL_TABLET | Freq: Four times a day (QID) | ORAL | Status: DC | PRN

## 2023-08-14 MED ORDER — LORAZEPAM 2 MG/ML IJ SOLN: 0.5000 mg | Freq: Four times a day (QID) | INTRAMUSCULAR | Status: AC | PRN

## 2023-08-14 MED ORDER — HEPARIN (PORCINE) 25000 UT/250ML-% IV SOLN
1000.0000 [IU]/h | INTRAVENOUS | Status: DC
  Administered 2023-08-14: 800 [IU]/h via INTRAVENOUS
  Filled 2023-08-14 (×2): qty 250

## 2023-08-14 MED ORDER — ACETAMINOPHEN 325 MG PO TABS
650.0000 mg | ORAL_TABLET | Freq: Four times a day (QID) | ORAL | Status: DC | PRN
  Administered 2023-08-14 – 2023-08-15 (×2): 650 mg via ORAL
  Filled 2023-08-14 (×2): qty 2

## 2023-08-14 MED ORDER — NITROGLYCERIN 2 % TD OINT
1.0000 [in_us] | TOPICAL_OINTMENT | Freq: Four times a day (QID) | TRANSDERMAL | Status: DC | PRN
  Administered 2023-08-14: 1 [in_us] via TOPICAL
  Filled 2023-08-14: qty 1

## 2023-08-14 NOTE — ED Provider Notes (Signed)
 [facility] Provider Note   Event Date/Time   First MD Initiated Contact with Patient 08/14/23 1747     (approximate) History  Chest Pain  HPI Chelsea Greene is a 53 y.o. female with a stated past medical history of hypertension and anxiety who presents complaining of chest pain and tightness after being seen at [place] [facility] earlier today.  Patient states that this pain started yesterday while she was working out and has been intermittent since onset however now is present whenever she is sitting doing nothing.  Patient does endorse exertional worsening of this chest pain.  Patient also endorses pain that radiates to the left shoulder and back that began today. ROS: Patient currently denies any vision changes, tinnitus, difficulty speaking, facial droop, sore throat, shortness of breath, abdominal pain, nausea/vomiting/diarrhea, dysuria, or weakness/numbness/paresthesias in any extremity   Physical Exam  Triage Vital Signs: ED Triage Vitals  Encounter Vitals Group     BP 08/14/23 1540 (!) 150/77     Systolic BP Percentile --      Diastolic BP Percentile --      Pulse Rate 08/14/23 1540 78     Resp 08/14/23 1540 18     Temp 08/14/23 1540 98.2 F (36.8 C)     Temp Source 08/14/23 1540 Oral     SpO2 08/14/23 1540 100 %     Weight 08/14/23 1537 150 lb (68 kg)     Height 08/14/23 1537 5' 3 (1.6 m)     Head Circumference --      Peak Flow --      Pain Score 08/14/23 1537 6     Pain Loc --      Pain Education --      Exclude from Growth Chart --    Most recent vital signs: Vitals:   08/14/23 1930 08/14/23 2000  BP: (!) 142/79 (!) 155/101  Pulse: 86 83  Resp: (!) 21 19  Temp:    SpO2: 100% 99%   General: Awake, oriented x4. CV:  Good peripheral perfusion.  Resp:  Normal effort.  Abd:  No distention.  Other:  Middle-aged well-developed, well-nourished Caucasian female resting comfortably in no acute distress ED Results / Procedures / Treatments   Labs (all labs ordered are listed, but only abnormal results are displayed) Labs Reviewed  BASIC METABOLIC PANEL - Abnormal; Notable for the following components:      Result Value   Glucose, Bld 113 (*)    Creatinine, Ser 1.02 (*)    All other components within normal limits  TROPONIN I (HIGH SENSITIVITY) - Abnormal; Notable for the following components:   Troponin I (High Sensitivity) 23 (*)    All other components within normal limits  TROPONIN I (HIGH SENSITIVITY) - Abnormal; Notable for the following components:   Troponin I (High Sensitivity) 26 (*)    All other components within normal limits  CBC  APTT  PROTIME-INR  BASIC METABOLIC PANEL  CBC  URINE DRUG SCREEN, QUALITATIVE ([facility] ONLY)  HEPARIN  LEVEL (UNFRACTIONATED)   EKG ED ECG REPORT I, Artist MARLA Kerns, the attending physician, personally viewed and interpreted this ECG. Date: 08/14/2023 EKG Time: 1544 Rate: 89 Rhythm: normal sinus rhythm QRS Axis: normal Intervals: normal ST/T Wave abnormalities: normal Narrative Interpretation: no evidence of acute ischemia RADIOLOGY ED MD interpretation: 2 view chest x-ray interpreted by me shows no evidence of acute abnormalities including no pneumonia, pneumothorax, or widened mediastinum -Agree with radiology assessment Official radiology report(s): CT  Angio Chest Aorta w/CM &/OR wo/CM Result Date: 08/14/2023 CLINICAL DATA:  Acute aortic syndrome suspected. EXAM: CT ANGIOGRAPHY CHEST WITH CONTRAST TECHNIQUE: Multidetector CT imaging of the chest was performed using the standard protocol during bolus administration of intravenous contrast. Multiplanar CT image reconstructions and MIPs were obtained to evaluate the vascular anatomy. RADIATION DOSE REDUCTION: This exam was performed according to the departmental dose-optimization program which includes automated exposure control, adjustment of the mA and/or kV according to patient size and/or use of iterative reconstruction  technique. CONTRAST:  80mL OMNIPAQUE  IOHEXOL  350 MG/ML SOLN COMPARISON:  Chest CT 09/12/2013. FINDINGS: Cardiovascular: Ascending aorta is dilated measuring up to 4 cm. Heart is normal in size. There is adequate opacification of the pulmonary arteries to the segmental level. There is no evidence for pulmonary embolism. Mediastinum/Nodes: No enlarged mediastinal, hilar, or axillary lymph nodes. Thyroid gland, trachea, and esophagus demonstrate no significant findings. Lungs/Pleura: Subpleural nodular densities and fissural nodular densities measuring 3 mm or less are unchanged from 2016 compatible with benign etiology. Lungs are otherwise clear. There is no pleural effusion or pneumothorax Upper Abdomen: No acute abnormality. Musculoskeletal: Bilateral breast implants are present. No acute fractures are seen. Review of the MIP images confirms the above findings. IMPRESSION: 1. No evidence for pulmonary embolism. 2. No acute cardiopulmonary process. 3. 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 Recommend follow-up CT/MR every 12 months and vascular consultation. Electronically Signed   By: Greig Pique M.D.   On: 08/14/2023 18:39   DG Chest 2 View Result Date: 08/14/2023 CLINICAL DATA:  Chest pain and tightness radiating to the left shoulder and back. EXAM: CHEST - 2 VIEW COMPARISON:  CT 09/12/2013 FINDINGS: Heart size is normal. Mediastinal shadows are normal. The lungs are clear. No bronchial thickening. No infiltrate, mass, effusion or collapse. Pulmonary vascularity is normal. No bony abnormality. IMPRESSION: Normal chest. Electronically Signed   By: Oneil Officer M.D.   On: 08/14/2023 16:16   PROCEDURES: Critical Care performed: No Procedures MEDICATIONS ORDERED IN ED: Medications  acetaminophen  (TYLENOL ) tablet 650 mg (650 mg Oral Given 08/14/23 1955)    Or  acetaminophen  (TYLENOL ) suppository 650 mg ( Rectal See Alternative 08/14/23 1955)  ondansetron  (ZOFRAN ) tablet 4 mg (has no  administration in time range)    Or  ondansetron  (ZOFRAN ) injection 4 mg (has no administration in time range)  senna-docusate (Senokot-S) tablet 1 tablet (has no administration in time range)  hydrALAZINE  (APRESOLINE ) injection 5 mg (has no administration in time range)  nitroGLYCERIN  (NITROGLYN) 2 % ointment 1 inch (1 inch Topical Given 08/14/23 2310)  morphine  (PF) 2 MG/ML injection 2 mg (has no administration in time range)  heparin  bolus via infusion 4,000 Units (4,000 Units Intravenous Bolus from Bag 08/14/23 2003)    Followed by  heparin  ADULT infusion 100 units/mL (25000 units/250mL) (800 Units/hr Intravenous New Bag/Given 08/14/23 2004)  hydrochlorothiazide  (HYDRODIURIL ) tablet 12.5 mg (has no administration in time range)  levothyroxine  (SYNTHROID ) tablet 88 mcg (has no administration in time range)  estradiol -norethindrone  (ACTIVELLA) 1-0.5 MG per tablet 1 tablet (has no administration in time range)  LORazepam  (ATIVAN ) injection 0.5 mg (has no administration in time range)  iohexol  (OMNIPAQUE ) 350 MG/ML injection 80 mL (80 mLs Intravenous Contrast Given 08/14/23 1805)   IMPRESSION / MDM / ASSESSMENT AND PLAN / ED COURSE  I reviewed the triage vital signs and the nursing notes.  The patient is on the cardiac monitor to evaluate for evidence of arrhythmia and/or significant heart rate changes. Patient's presentation is most consistent with acute presentation with potential threat to life or bodily function. Workup: ECG, CXR, CBC, BMP, Troponin Findings: ECG: No overt evidence of STEMI. No evidence of Brugada's sign, delta wave, epsilon wave, significantly prolonged QTc, or malignant arrhythmia HS Troponin: Negative x1 Other Labs unremarkable for emergent problems. CXR: Without PTX, PNA, or widened mediastinum Last Stress Test: Never Last Heart Catheterization: Never HEART Score: 6  Differential diagnosis includes but is not limited to: ACS, Pneumothorax,  Pneumonia, Pulmonary Embolus, Tamponade, Aortic Dissection or other emergent problem as a cause for this presentation.  High Risk Chest Pain Patient at increased risk for Major Adverse Cardiac Event (AMI, PCI, CABG, death) Interventions: ASA 324mg  Defer Heparin  drip as patient pain free at this time,   Disposition: Admit for continued cardiac monitoring and trending of troponins as well as further evaluation for potential inpatient stress testing vs cardiac catheterization and coronary angiography.   FINAL CLINICAL IMPRESSION(S) / ED DIAGNOSES   Final diagnoses:  Unstable angina (HCC)  Chest pain, unspecified type   Rx / DC Orders   ED Discharge Orders     None      Note:  This document was prepared using Dragon voice recognition software and may include unintentional dictation errors.   Brinly Maietta K, MD 08/14/23 [phone]

## 2023-08-14 NOTE — Assessment & Plan Note (Addendum)
 Hydralazine 5 mg every 6 hours as needed for SBP>160, 5 days ordered

## 2023-08-14 NOTE — Hospital Course (Addendum)
 [name] is a 53 year old female with history of hypertension, anxiety, hypothyroid, hyperlipidemia, who presents to the emergency department for chief concerns of chest pain, chest tightness.  Vitals in the ED showed T 98.2, rr 18, heart rate 78, blood pressure 150/77, SpO2 100% on room air.  Serum sodium is 138, potassium 4.5, chloride 99, bicarb 29, BUN of 20, sCr 1.02, eGFR greater than 60, nonfasting blood glucose 113, WBC 9.2, hemoglobin 12.9, platelets of 319.  HS troponin is 23 and on repeat is 29.  CTA Chest Aorta w/cm or wo/cm: no evidence of pulmonary embolism. No acute cardiopulmonary process. 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   1/8: Vital stable, echocardiogram without any significant abnormality.  Patient did not met criteria for NSTEMI so it ruled out.  Likely unstable angina.  CTA coronary was done by cardiology and there was a concerning RCA stenosis so they are planning to do cardiac cath tomorrow morning.  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  Patient will need at least 5-month of DAPT.  Also found to have stenosis on the left side which will be currently treated medically.  Cardiology also added Crestor , stopped HCTZ and added metoprolol .  1/10: Remained hemodynamically stable.  Hemoglobin at 10.6 today.  Patient was started on supplement.  She was able to walk without any shortness of breath or chest pain.  Cardiology cleared her for discharge on metoprolol , aspirin  and prasugrel  as DAPT.  Lipoprotein a levels were obtained and pending results which will be discussed by her cardiologist as outpatient.  Patient was also started on high intensity statin.  She will continue on current medications and need to have a close follow-up with her providers for further management.

## 2023-08-14 NOTE — Assessment & Plan Note (Signed)
-   Levothyroxine 88 mcg daily before breakfast resumed

## 2023-08-14 NOTE — Assessment & Plan Note (Addendum)
 Patient had barely positive troponin with a flat curve, likely unstable angina so NSTEMI ruled out. Echocardiogram without any significant abnormality but CTA  coronary with concern of RCA stenosis. Cardiologist planning cardiac catheterization tomorrow morning. -Continuing heparin  infusion -Rest of the management per cardiology -Checking A1c and lipid panel to complete the workup

## 2023-08-14 NOTE — H&P (Signed)
 History and Physical   Chelsea Greene FMW:[id number] DOB: 1971-05-08 DOA: 08/14/2023  PCP: Chelsea Amis, MD  Outpatient Specialists: Dr. [facility] Patient coming from: Home via POV  I have personally briefly reviewed patient's old medical records in [facility] EMR.  Chief Concern: Chest tightness  HPI: [name] is a 53 year old female with history of hypertension, anxiety, hypothyroid, hyperlipidemia, who presents to the emergency department for chief concerns of chest pain, chest tightness.  Vitals in the ED showed T 98.2, rr 18, heart rate 78, blood pressure 150/77, SpO2 100% on room air.  Serum sodium is 138, potassium 4.5, chloride 99, bicarb 29, BUN of 20, sCr 1.02, eGFR greater than 60, nonfasting blood glucose 113, WBC 9.2, hemoglobin 12.9, platelets of 319.  HS troponin is 23 and on repeat is 29.  CTA Chest Aorta w/cm or wo/cm: no evidence of pulmonary embolism. No acute cardiopulmonary process. 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   ED: none ----------------------------------- At bedside, she is able to tell me her name, age, current location, current year.  She had a headache yesterday.  She went to the gym despite the headache last evening.  She did recumbant bike, sitting down and peddling, She did this for about 45 minutes max.    She developed difficulty beathing (shortness of breath), and chest tightness while pedaling the bike. She stopped, and the chest tightness and shortness of breath eased off, and it was still somewhat pesent. She then left the gym because she developed chest discomfort.  Today, She developed neck discomfort on the left side and towards the back neck. She developed a headache that radiated tightness down her left back neck.  She endorsed associated left shoulder discomfort that was initially worse with exertion and relieved with rest today.  This progressed to chest tightness and shrotness of  breath at rest and associated neck and left shoulder discomfort.  This prompted her to present to the emergency department for further evaluation.  Family history: mother passed from MI at late 50s/early 19s.  Social history: She lives at home wit her husband. She denies tobacco, recreational drug use. She occassional etoh use, last drink was Friday, and it was, three cocktails. She just drinks when she goes to beach on the weekends.  She currently works at a computer/desk job.  ROS: Constitutional: no weight change, no fever ENT/Mouth: no sore throat, no rhinorrhea Eyes: no eye pain, no vision changes Cardiovascular: + chest tightness, + dyspnea,  no edema, no palpitations Respiratory: no cough, no sputum, no wheezing Gastrointestinal: no nausea, no vomiting, no diarrhea, no constipation Genitourinary: no urinary incontinence, no dysuria, no hematuria Musculoskeletal: no arthralgias, no myalgias Skin: no skin lesions, no pruritus, Neuro: no weakness, no loss of consciousness, no syncope Psych: no anxiety, no depression, no decrease appetite Heme/Lymph: no bruising, no bleeding  ED Course: Discussed with EDP, patient requiring hospitalization for chief concerns of chest pain.  Assessment/Plan  Principal Problem:   NSTEMI (non-ST elevated myocardial infarction) [facility]) Active Problems:   Chest pain   Essential hypertension   Hyperglycemia   Hypothyroidism   Depression   Assessment and Plan:  * NSTEMI (non-ST elevated myocardial infarction) (HCC) Heparin  per pharmacy has been initiated Nitroglycerin  ointment, 1 inch every 6 hours as needed for chest pain, 3 days ordered Morphine  2 mg IV every 4 hours as needed for chest pain not responsive to nitro glycerin ointment, 20 hours ordered Complete echo has been ordered Cardiology  has been consulted Admit to telemetry cardiac, inpatient  Depression Home escitalopram  5 mg daily resumed  Hypothyroidism Levothyroxine  88 mcg daily  before breakfast resumed  Hyperglycemia This is a nonfasting value, no indication for insulin at this time We will continue monitoring as an inpatient with scheduled labs as appropriate  Essential hypertension Hydralazine  5 mg every 6 hours as needed for SBP>160, 5 days ordered  Chest pain Nitroglycerin  1 inch every 6 hours as needed for chest pain, 3 days ordered Morphine  2 mg every 4 hours as needed for chest pain, not responsive to nitro ointment Check UDS  Chart reviewed.   DVT prophylaxis: Heparin  GTT Code Status: Full code Diet: Heart healthy diet; n.p.o. after midnight Family Communication: Husband was updated as he was on speaker phone Disposition Plan: Ending clinical course Consults called: Cardiology has been consulted Admission status: Telemetry cardiac, inpatient  Past Medical History:  Diagnosis Date   Anxiety    Hypertension    Medical history non-contributory    Varicose veins of both lower extremities    Past Surgical History:  Procedure Laterality Date   TONSILLECTOMY     Social History:  reports that she has never smoked. She has never used smokeless tobacco. She reports current alcohol use. She reports that she does not use drugs.  No Known Allergies Family History  Problem Relation Age of Onset   Heart attack Mother    Asthma Mother    Cancer Father    Aneurysm Sister    Family history: Family history reviewed and not pertinent.  Prior to Admission medications   Medication Sig Start Date End Date Taking? Authorizing Provider  levothyroxine  (SYNTHROID ) 88 MCG tablet Take 88 mcg by mouth daily before breakfast. 06/26/23  Yes [provider]  ARMOUR THYROID 60 MG tablet  09/21/17   [provider]  baclofen  (LIORESAL ) 10 MG tablet Take 1 tablet (10 mg total) by mouth 2 (two) times daily. 04/13/20   Brimage, Vondra, DO  colchicine (COLCRYS) 0.6 MG tablet Colcrys 0.6 mg tablet    [provider]  escitalopram  (LEXAPRO ) 10 MG  tablet Take 10 mg by mouth daily. 03/16/20   [provider]  ESTRING  2 MG vaginal ring  12/02/17   [provider]  hydrochlorothiazide  (HYDRODIURIL ) 12.5 MG tablet Take 12.5 mg by mouth daily. 01/18/20  Yes [provider]  hydrocortisone (PROCTOSOL HC) 2.5 % rectal cream Proctosol HC 2.5 % topical cream perineal applicator  APPLY SPARINGLY TO AFFECTED AREA 2 TO 4 TIMES A DAY    [provider]  meloxicam (MOBIC) 15 MG tablet meloxicam 15 mg tablet  TAKE 1 TABLET BY MOUTH ONCE A DAY AS NEEDED FOR PAIN TAKE WITH FOOD    [provider]  MIMVEY 1-0.5 MG tablet TAKE 1 TABLET(S) EVERY DAY BY ORAL ROUTE. 08/22/17  Yes [provider]  phentermine 15 MG capsule phentermine 15 mg capsule  TAKE ONE CAPSULE BY MOUTH EVERY DAY    [provider]  PIRMELLA 1/35 tablet  12/05/17   [provider]  progesterone (PROMETRIUM) 100 MG capsule progesterone micronized 100 mg capsule  Take 1 capsule every day by oral route for 90 days.    [provider]  vitamin B-12 (CYANOCOBALAMIN) 1000 MCG tablet Take by mouth. Patient not taking: Reported on 08/14/2023    [provider]   Physical Exam: Vitals:   08/14/23 1830 08/14/23 1900 08/14/23 1930 08/14/23 2000  BP: 132/86 (!) 148/85 (!) 142/79 (!) 155/101  Pulse: 70 82 86 83  Resp: 13 14 (!) 21 19  Temp:      TempSrc:      SpO2: 100% 100% 100% 99%  Weight:      Height:       Constitutional: appears age-appropriate, mildly anxious Eyes: PERRL, lids and conjunctivae normal ENMT: Mucous membranes are moist. Posterior pharynx clear of any exudate or lesions. Age-appropriate dentition. Hearing appropriate Neck: normal, supple, no masses, no thyromegaly Respiratory: clear to auscultation bilaterally, no wheezing, no crackles. Normal respiratory effort. No accessory muscle use.  Cardiovascular: Regular rate and rhythm, no murmurs / rubs / gallops. No extremity edema. 2+ pedal  pulses. No carotid bruits.  Abdomen: no tenderness, no masses palpated, no hepatosplenomegaly. Bowel sounds positive.  Musculoskeletal: no clubbing / cyanosis. No joint deformity upper and lower extremities. Good ROM, no contractures, no atrophy. Normal muscle tone.  Skin: no rashes, lesions, ulcers. No induration Neurologic: Sensation intact. Strength 5/5 in all 4.  Psychiatric: Normal judgment and insight. Alert and oriented x 3. Normal mood.   EKG: independently reviewed, showing sinus rhythm with rate of 89, QTc 464  Chest x-ray on Admission: I personally reviewed and I agree with radiologist reading as below.  CT Angio Chest Aorta w/CM &/OR wo/CM Result Date: 08/14/2023 CLINICAL DATA:  Acute aortic syndrome suspected. EXAM: CT ANGIOGRAPHY CHEST WITH CONTRAST TECHNIQUE: Multidetector CT imaging of the chest was performed using the standard protocol during bolus administration of intravenous contrast. Multiplanar CT image reconstructions and MIPs were obtained to evaluate the vascular anatomy. RADIATION DOSE REDUCTION: This exam was performed according to the departmental dose-optimization program which includes automated exposure control, adjustment of the mA and/or kV according to patient size and/or use of iterative reconstruction technique. CONTRAST:  80mL OMNIPAQUE  IOHEXOL  350 MG/ML SOLN COMPARISON:  Chest CT 09/12/2013. FINDINGS: Cardiovascular: Ascending aorta is dilated measuring up to 4 cm. Heart is normal in size. There is adequate opacification of the pulmonary arteries to the segmental level. There is no evidence for pulmonary embolism. Mediastinum/Nodes: No enlarged mediastinal, hilar, or axillary lymph nodes. Thyroid gland, trachea, and esophagus demonstrate no significant findings. Lungs/Pleura: Subpleural nodular densities and fissural nodular densities measuring 3 mm or less are unchanged from 2016 compatible with benign etiology. Lungs are otherwise clear. There is no pleural effusion  or pneumothorax Upper Abdomen: No acute abnormality. Musculoskeletal: Bilateral breast implants are present. No acute fractures are seen. Review of the MIP images confirms the above findings. IMPRESSION: 1. No evidence for pulmonary embolism. 2. No acute cardiopulmonary process. 3. 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 Recommend follow-up CT/MR every 12 months and vascular consultation. Electronically Signed   By: Greig Pique M.D.   On: 08/14/2023 18:39   DG Chest 2 View Result Date: 08/14/2023 CLINICAL DATA:  Chest pain and tightness radiating to the left shoulder and back. EXAM: CHEST - 2 VIEW COMPARISON:  CT 09/12/2013 FINDINGS: Heart size is normal. Mediastinal shadows are normal. The lungs are clear. No bronchial thickening. No infiltrate, mass, effusion or collapse. Pulmonary vascularity is normal. No bony abnormality. IMPRESSION: Normal chest. Electronically Signed   By: Oneil Officer M.D.   On: 08/14/2023 16:16   Labs on Admission: I have personally reviewed following labs  CBC: Recent Labs  Lab 08/14/23 1545  WBC 9.2  HGB 12.9  HCT 39.1  MCV 91.6  PLT 319   Basic Metabolic Panel: Recent Labs  Lab 08/14/23 1545  NA 138  K 4.5  CL 99  CO2 29  GLUCOSE 113*  BUN 20  CREATININE 1.02*  CALCIUM  10.0   GFR: Estimated Creatinine Clearance: 59 mL/min (A) (by C-G formula based on SCr of 1.02 mg/dL (H)).  Coagulation Profile: Recent Labs  Lab 08/14/23 1751  INR 1.0   Urine analysis:    Component Value Date/Time   COLORURINE Yellow 01/31/2012 0826   APPEARANCEUR Cloudy 01/31/2012 0826   LABSPEC 1.018 01/31/2012 0826   PHURINE 8.0 01/31/2012 0826   GLUCOSEU Negative 01/31/2012 0826   HGBUR Negative 01/31/2012 0826   BILIRUBINUR Negative 01/31/2012 0826   KETONESUR 1+ 01/31/2012 0826   PROTEINUR Negative 01/31/2012 0826   NITRITE Negative 01/31/2012 0826   LEUKOCYTESUR Negative 01/31/2012 0826   CRITICAL CARE Performed by: Dr. Sherre  Total  critical care time: 32 minutes  Critical care time was exclusive of separately billable procedures and treating other patients.  Critical care was necessary to treat or prevent imminent or life-threatening deterioration.  Critical care was time spent personally by me on the following activities: development of treatment plan with patient and spouse as well as nursing, discussions with consultants, evaluation of patient's response to treatment, examination of patient, obtaining history from patient or surrogate, ordering and performing treatments and interventions, ordering and review of laboratory studies, ordering and review of radiographic studies, pulse oximetry and re-evaluation of patient's condition.  This document was prepared using Dragon Voice Recognition software and may include unintentional dictation errors.  Dr. Sherre [facility]  If 7PM-7AM, please contact overnight-coverage provider If 7AM-7PM, please contact day attending provider [email or web address]  08/14/2023, 8:37 PM

## 2023-08-14 NOTE — ED Notes (Signed)
 Extra tube had been sent when the repeat troponin was ordered for coagulation levels.  Lab called to ensure they would run

## 2023-08-14 NOTE — Consult Note (Signed)
 PHARMACY - ANTICOAGULATION CONSULT NOTE  Pharmacy Consult for IV Heparin  Indication: chest pain/ACS  Patient Measurements: Height: 5' 3 (160 cm) Weight: 68 kg (150 lb) IBW/kg (Calculated) : 52.4 Heparin  Dosing Weight: 66.3 kg  Labs: Recent Labs    08/14/23 1545 08/14/23 1751  HGB 12.9  --   HCT 39.1  --   PLT 319  --   CREATININE 1.02*  --   TROPONINIHS 23* 26*   Estimated Creatinine Clearance: 59 mL/min (A) (by C-G formula based on SCr of 1.02 mg/dL (H)).  Medical History: Past Medical History:  Diagnosis Date   Anxiety    Hypertension    Medical history non-contributory    Varicose veins of both lower extremities    Medications:  Medication reconciliation is pending. No apparent anticoagulation prior to admission  Assessment: Patient is a 53 y/o F with medical history as above presenting to the ED with chest pain and tightness with radiation to left shoulder and back. There is concern for NSTEMI. Pharmacy consulted to initiate and manage heparin  infusion.   Baseline aPTT and PT-INR are pending. Baseline CBC within normal limits.  Goal of Therapy:  Heparin  level 0.3-0.7 units/ml Monitor platelets by anticoagulation protocol: Yes   Plan:  --Heparin  4000 units IV bolus followed by continuous infusion at 800 units/hr --Heparin  level 6 hours from initiation of infusion --Daily CBC per protocol while on IV heparin   Chelsea Greene 08/14/2023,7:42 PM

## 2023-08-14 NOTE — Assessment & Plan Note (Addendum)
 CBG within goal. -Check A1c -Continue to monitor

## 2023-08-14 NOTE — Assessment & Plan Note (Signed)
 Home escitalopram 5 mg daily resumed

## 2023-08-14 NOTE — ED Triage Notes (Signed)
 Pt ambulatory to triage.  Pt was sent from [facility] for eval of chest pain and tightness.  Intermittent pain radiates into left shoulder and back.  Pt also reports a headache.    Sx began last night.  Pt alert  speech clear.

## 2023-08-14 NOTE — Assessment & Plan Note (Addendum)
 Nitroglycerin 1 inch every 6 hours as needed for chest pain, 3 days ordered Morphine 2 mg every 4 hours as needed for chest pain, not responsive to nitro ointment UDS pending

## 2023-08-15 ENCOUNTER — Inpatient Hospital Stay: Payer: Self-pay

## 2023-08-15 ENCOUNTER — Inpatient Hospital Stay: Payer: 59

## 2023-08-15 DIAGNOSIS — I200 Unstable angina: Secondary | ICD-10-CM

## 2023-08-15 DIAGNOSIS — E039 Hypothyroidism, unspecified: Secondary | ICD-10-CM

## 2023-08-15 DIAGNOSIS — F32A Depression, unspecified: Secondary | ICD-10-CM

## 2023-08-15 DIAGNOSIS — I10 Essential (primary) hypertension: Secondary | ICD-10-CM

## 2023-08-15 DIAGNOSIS — R739 Hyperglycemia, unspecified: Secondary | ICD-10-CM

## 2023-08-15 DIAGNOSIS — I214 Non-ST elevation (NSTEMI) myocardial infarction: Secondary | ICD-10-CM | POA: Diagnosis not present

## 2023-08-15 LAB — LIPID PANEL
Cholesterol: 203 mg/dL — ABNORMAL HIGH (ref 0–200)
HDL: 71 mg/dL (ref >40)
LDL Cholesterol: 121 mg/dL — ABNORMAL HIGH (ref 0–99)
Total CHOL/HDL Ratio: 2.9 {ratio}
Triglycerides: 54 mg/dL (ref <150)
VLDL: 11 mg/dL (ref 0–40)

## 2023-08-15 LAB — HEPARIN LEVEL (UNFRACTIONATED)
Heparin Unfractionated: 0.18 [IU]/mL — ABNORMAL LOW (ref 0.30–0.70)
Heparin Unfractionated: 0.49 [IU]/mL (ref 0.30–0.70)
Heparin Unfractionated: 0.46 [IU]/mL (ref 0.30–0.70)

## 2023-08-15 LAB — CBC
HCT: 33.1 % — ABNORMAL LOW (ref 36.0–46.0)
Hemoglobin: 11.1 g/dL — ABNORMAL LOW (ref 12.0–15.0)
MCH: 30 pg (ref 26.0–34.0)
MCHC: 33.5 g/dL (ref 30.0–36.0)
MCV: 89.5 fL (ref 80.0–100.0)
Platelets: 271 10*3/uL (ref 150–400)
RBC: 3.7 MIL/uL — ABNORMAL LOW (ref 3.87–5.11)
RDW: 12.8 % (ref 11.5–15.5)
WBC: 5.8 10*3/uL (ref 4.0–10.5)
nRBC: 0 % (ref 0.0–0.2)

## 2023-08-15 LAB — BASIC METABOLIC PANEL
Anion gap: 11 (ref 5–15)
BUN: 17 mg/dL (ref 6–20)
CO2: 27 mmol/L (ref 22–32)
Calcium: 8.9 mg/dL (ref 8.9–10.3)
Chloride: 102 mmol/L (ref 98–111)
Creatinine, Ser: 0.93 mg/dL (ref 0.44–1.00)
GFR, Estimated: >60 mL/min (ref >60)
Glucose, Bld: 92 mg/dL (ref 70–99)
Potassium: 3.5 mmol/L (ref 3.5–5.1)
Sodium: 140 mmol/L (ref 135–145)

## 2023-08-15 LAB — ECHOCARDIOGRAM COMPLETE
Area-P 1/2: 4.21 cm2
Height: 63 in
S' Lateral: 2.8 cm
Weight: 2400 [oz_av]

## 2023-08-15 LAB — MAGNESIUM: Magnesium: 1.8 mg/dL (ref 1.7–2.4)

## 2023-08-15 LAB — TROPONIN I (HIGH SENSITIVITY): Troponin I (High Sensitivity): 21 ng/L — ABNORMAL HIGH (ref <18)

## 2023-08-15 MED ORDER — METOPROLOL TARTRATE 5 MG/5ML IV SOLN
10.0000 mg | Freq: Once | INTRAVENOUS | Status: AC
  Administered 2023-08-15: 10 mg via INTRAVENOUS

## 2023-08-15 MED ORDER — METOPROLOL SUCCINATE ER 25 MG PO TB24: 25.0000 mg | ORAL_TABLET | Freq: Every day | ORAL | Status: DC

## 2023-08-15 MED ORDER — MUPIROCIN 2 % EX OINT
1.0000 | TOPICAL_OINTMENT | Freq: Two times a day (BID) | CUTANEOUS | Status: DC
  Filled 2023-08-15: qty 22

## 2023-08-15 MED ORDER — NITROGLYCERIN 0.4 MG SL SUBL
SUBLINGUAL_TABLET | SUBLINGUAL | Status: AC
  Filled 2023-08-15: qty 2

## 2023-08-15 MED ORDER — SODIUM CHLORIDE 0.9% FLUSH: 3.0000 mL | INTRAVENOUS | Status: DC | PRN

## 2023-08-15 MED ORDER — NITROGLYCERIN 0.4 MG SL SUBL
0.8000 mg | SUBLINGUAL_TABLET | SUBLINGUAL | Status: AC
  Administered 2023-08-15: 0.8 mg via SUBLINGUAL

## 2023-08-15 MED ORDER — ASPIRIN 325 MG PO TABS: 325.0000 mg | ORAL_TABLET | Freq: Every day | ORAL | Status: DC

## 2023-08-15 MED ORDER — ROSUVASTATIN CALCIUM 10 MG PO TABS
20.0000 mg | ORAL_TABLET | Freq: Every day | ORAL | Status: DC
  Administered 2023-08-15: 20 mg via ORAL
  Filled 2023-08-15: qty 1

## 2023-08-15 MED ORDER — SODIUM CHLORIDE 0.9% FLUSH
3.0000 mL | Freq: Two times a day (BID) | INTRAVENOUS | Status: DC
  Administered 2023-08-16: 3 mL via INTRAVENOUS

## 2023-08-15 MED ORDER — ASPIRIN 81 MG PO CHEW
81.0000 mg | CHEWABLE_TABLET | ORAL | Status: AC
Start: 2023-08-16 — End: 2023-08-17
  Administered 2023-08-16: 81 mg via ORAL
  Filled 2023-08-15: qty 1

## 2023-08-15 MED ORDER — ASPIRIN 81 MG PO TBEC: 81.0000 mg | DELAYED_RELEASE_TABLET | Freq: Every day | ORAL | Status: DC

## 2023-08-15 MED ORDER — ASPIRIN 325 MG PO TABS
325.0000 mg | ORAL_TABLET | Freq: Once | ORAL | Status: AC
  Administered 2023-08-15: 325 mg via ORAL
  Filled 2023-08-15: qty 1

## 2023-08-15 MED ORDER — METOPROLOL SUCCINATE ER 50 MG PO TB24: 25.0000 mg | ORAL_TABLET | Freq: Every day | ORAL | Status: DC

## 2023-08-15 MED ORDER — HEPARIN BOLUS VIA INFUSION
2000.0000 [IU] | Freq: Once | INTRAVENOUS | Status: AC
  Administered 2023-08-15: 2000 [IU] via INTRAVENOUS
  Filled 2023-08-15: qty 2000

## 2023-08-15 MED ORDER — IOHEXOL 350 MG/ML SOLN
80.0000 mL | Freq: Once | INTRAVENOUS | Status: AC | PRN
  Administered 2023-08-15: 80 mL via INTRAVENOUS

## 2023-08-15 MED ORDER — IVABRADINE HCL 5 MG PO TABS
5.0000 mg | ORAL_TABLET | Freq: Once | ORAL | Status: AC
  Administered 2023-08-15: 5 mg via ORAL
  Filled 2023-08-15: qty 1

## 2023-08-15 MED ORDER — METOPROLOL TARTRATE 5 MG/5ML IV SOLN
INTRAVENOUS | Status: AC
  Filled 2023-08-15: qty 10

## 2023-08-15 MED ORDER — SODIUM CHLORIDE 0.9 % IV SOLN: 250.0000 mL | INTRAVENOUS | Status: DC | PRN

## 2023-08-15 NOTE — Consult Note (Signed)
 PHARMACY - ANTICOAGULATION CONSULT NOTE  Pharmacy Consult for IV Heparin  Indication: chest pain/ACS  Patient Measurements: Height: 5' 3 (160 cm) Weight: 68 kg (150 lb) IBW/kg (Calculated) : 52.4 Heparin  Dosing Weight: 66.3 kg  Labs: Recent Labs    08/14/23 1545 08/14/23 1751 08/15/23 0551  HGB 12.9  --  11.1*  HCT 39.1  --  33.1*  PLT 319  --  271  APTT  --  27  --   LABPROT  --  13.1  --   INR  --  1.0  --   HEPARINUNFRC  --   --  0.18*  CREATININE 1.02*  --  0.93  TROPONINIHS 23* 26*  --    Estimated Creatinine Clearance: 64.7 mL/min (by C-G formula based on SCr of 0.93 mg/dL).  Medical History: Past Medical History:  Diagnosis Date   Anxiety    Hypertension    Medical history non-contributory    Varicose veins of both lower extremities    Medications:  Medication reconciliation is pending. No apparent anticoagulation prior to admission  Assessment: Patient is a 53 y/o F with medical history as above presenting to the ED with chest pain and tightness with radiation to left shoulder and back. There is concern for NSTEMI. Pharmacy consulted to initiate and manage heparin  infusion.   Baseline aPTT and PT-INR are pending. Baseline CBC within normal limits.  Goal of Therapy:  Heparin  level 0.3-0.7 units/ml Monitor platelets by anticoagulation protocol: Yes  1/8 0020  Received call from RN.  Just took over pt.  Found heparin  line disconnected with liquid on floor. Unsure how long line was disconnected and unsure if pt was even given initial bolus.  Instructed RN to reconnect line and continue heparin  infusion at 800 unit/hr.  Will check HL @ 0600. 1/8 0551 HL  0.18, subtherapeutic   Plan:  --Heparin  2000 units IV bolus  --Increase heparin  infusion to 1000 units/hr --Recheck HL in 6 hours after rate change --Daily CBC per protocol while on IV heparin   Nasya Vincent S. Shanyce Daris, PharmD, [facility] 08/15/2023 6:37 AM

## 2023-08-15 NOTE — Consult Note (Signed)
 PHARMACY - ANTICOAGULATION CONSULT NOTE  Pharmacy Consult for IV Heparin  Indication: chest pain/ACS  Patient Measurements: Height: 5' 3 (160 cm) Weight: 68 kg (150 lb) IBW/kg (Calculated) : 52.4 Heparin  Dosing Weight: 66.3 kg  Labs: Recent Labs    08/14/23 1545 08/14/23 1751 08/15/23 0551 08/15/23 1412  HGB 12.9  --  11.1*  --   HCT 39.1  --  33.1*  --   PLT 319  --  271  --   APTT  --  27  --   --   LABPROT  --  13.1  --   --   INR  --  1.0  --   --   HEPARINUNFRC  --   --  0.18* 0.49  CREATININE 1.02*  --  0.93  --   TROPONINIHS 23* 26* 21*  --    Estimated Creatinine Clearance: 64.7 mL/min (by C-G formula based on SCr of 0.93 mg/dL).  Medical History: Past Medical History:  Diagnosis Date   Anxiety    Hypertension    Medical history non-contributory    Varicose veins of both lower extremities    Medications:  Medication reconciliation is pending. No apparent anticoagulation prior to admission  Assessment: Patient is a 53 y/o F with medical history as above presenting to the ED with chest pain and tightness with radiation to left shoulder and back. There is concern for NSTEMI. Pharmacy consulted to initiate and manage heparin  infusion.   Baseline aPTT and PT-INR are pending. Baseline CBC within normal limits.  Goal of Therapy:  Heparin  level 0.3-0.7 units/ml Monitor platelets by anticoagulation protocol: Yes  1/8 0020  Received call from RN.  Just took over pt.  Found heparin  line disconnected with liquid on floor. Unsure how long line was disconnected and unsure if pt was even given initial bolus.  Instructed RN to reconnect line and continue heparin  infusion at 800 unit/hr.  Will check HL @ 0600. 1/8 0551 HL  0.18, subtherapeutic 1/8 1412 HL 0.49, therapeutic x1   Plan:  Continue heparin  infusion at 1000 units/hr Recheck confirmatory HL in 6 hours Monitor heparin  levels daily while on heparin  infusion Monitor CBC and signs/symptoms of bleeding  Thank  you for involving pharmacy in this patient's care.   Damien Napoleon, PharmD Clinical Pharmacist 08/15/2023 2:49 PM

## 2023-08-15 NOTE — ED Notes (Signed)
 This RN went to assist pt with the bathroom.. this RN noticed a wet spot on the bed, and upon inspection this RN noticed the heparin  drip was not connected to the patient. It appears the cap was still intact on the tubing, so it could be assumed that the patient did not receive the bolus and it has not been infusing since 20:03 when initiated. This RN notified pharmacy and provider. Pharmacy will modify PTT check.

## 2023-08-15 NOTE — Consult Note (Signed)
 PHARMACY - ANTICOAGULATION CONSULT NOTE  Pharmacy Consult for IV Heparin  Indication: chest pain/ACS  Patient Measurements: Height: 5' 3 (160 cm) Weight: 68 kg (150 lb) IBW/kg (Calculated) : 52.4 Heparin  Dosing Weight: 66.3 kg  Labs: Recent Labs    08/14/23 1545 08/14/23 1751  HGB 12.9  --   HCT 39.1  --   PLT 319  --   APTT  --  27  LABPROT  --  13.1  INR  --  1.0  CREATININE 1.02*  --   TROPONINIHS 23* 26*   Estimated Creatinine Clearance: 59 mL/min (A) (by C-G formula based on SCr of 1.02 mg/dL (H)).  Medical History: Past Medical History:  Diagnosis Date   Anxiety    Hypertension    Medical history non-contributory    Varicose veins of both lower extremities    Medications:  Medication reconciliation is pending. No apparent anticoagulation prior to admission  Assessment: Patient is a 53 y/o F with medical history as above presenting to the ED with chest pain and tightness with radiation to left shoulder and back. There is concern for NSTEMI. Pharmacy consulted to initiate and manage heparin  infusion.   Baseline aPTT and PT-INR are pending. Baseline CBC within normal limits.  Goal of Therapy:  Heparin  level 0.3-0.7 units/ml Monitor platelets by anticoagulation protocol: Yes  1/8/ 0020  Received call from RN.  Just took over pt.  Found heparin  line disconnected with liquid on floor. Unsure how long line was disconnected and unsure if pt was even given initial bolus.  Instructed RN to reconnect line and continue heparin  infusion at 800 unit/hr.  Will check HL @ 0600.   Plan:  --Heparin  4000 units IV bolus followed by continuous infusion at 800 units/hr --Heparin  level 6 hours from initiation of infusion --Daily CBC per protocol while on IV heparin   Rankin CANDIE Dills, PharmD, [facility] 08/15/2023 12:23 AM

## 2023-08-15 NOTE — Progress Notes (Signed)
  Progress Note   Patient: Chelsea Greene FMW:[id number] DOB: Apr 14, 1971 DOA: 08/14/2023     1 DOS: the patient was seen and examined on 08/15/2023   Brief hospital course: Chelsea Greene is a 53 year old female with history of hypertension, anxiety, hypothyroid, hyperlipidemia, who presents to the emergency department for chief concerns of chest pain, chest tightness.  Vitals in the ED showed T 98.2, rr 18, heart rate 78, blood pressure 150/77, SpO2 100% on room air.  Serum sodium is 138, potassium 4.5, chloride 99, bicarb 29, BUN of 20, sCr 1.02, eGFR greater than 60, nonfasting blood glucose 113, WBC 9.2, hemoglobin 12.9, platelets of 319.  HS troponin is 23 and on repeat is 29.  CTA Chest Aorta w/cm or wo/cm: no evidence of pulmonary embolism. No acute cardiopulmonary process. 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   1/8: Vital stable, echocardiogram without any significant abnormality.  Patient did not met criteria for NSTEMI so it ruled out.  Likely unstable angina.  CTA coronary was done by cardiology and there was a concerning RCA stenosis so they are planning to do cardiac cath tomorrow morning.  Assessment and Plan: * NSTEMI (non-ST elevated myocardial infarction) [facility]) Patient had barely positive troponin with a flat curve, likely unstable angina so NSTEMI ruled out. Echocardiogram without any significant abnormality but CTA  coronary with concern of RCA stenosis. Cardiologist planning cardiac catheterization tomorrow morning. -Continuing heparin  infusion -Rest of the management per cardiology -Checking A1c and lipid panel to complete the workup  Chest pain Nitroglycerin  1 inch every 6 hours as needed for chest pain, 3 days ordered Morphine  2 mg every 4 hours as needed for chest pain, not responsive to nitro ointment UDS pending  [facility] escitalopram  5 mg daily resumed  Hypothyroidism Levothyroxine  88 mcg daily before breakfast  resumed  Hyperglycemia CBG within goal. -Check A1c -Continue to monitor  Essential hypertension Hydralazine  5 mg every 6 hours as needed for SBP>160, 5 days ordered   Subjective: Patient was seen and examined today.  No chest pain or shortness of breath.  Still feeling little tightness around her chest.  Physical Exam: Vitals:   08/15/23 1046 08/15/23 1103 08/15/23 1118 08/15/23 1135  BP:  (!) 107/52 104/65 90/64  Pulse: 73 72 75 68  Resp: 14 17 17 18   Temp:    (!) 97.2 F (36.2 C)  TempSrc:    Oral  SpO2: 98%   98%  Weight:      Height:       General.  Well-developed lady, in no acute distress. Pulmonary.  Lungs clear bilaterally, normal respiratory effort. CV.  Regular rate and rhythm, no JVD, rub or murmur. Abdomen.  Soft, nontender, nondistended, BS positive. CNS.  Alert and oriented .  No focal neurologic deficit. Extremities.  No edema, no cyanosis, pulses intact and symmetrical. Psychiatry.  Judgment and insight appears normal.   Data Reviewed: Prior data reviewed  Family Communication: Discussed with patient  Disposition: Status is: Inpatient Remains inpatient appropriate because: Severity of illness  Planned Discharge Destination: Home  DVT prophylaxis.  Heparin  infusion Time spent: 50 minutes  This record has been created using Conservation officer, historic buildings. Errors have been sought and corrected,but may not always be located. Such creation errors do not reflect on the standard of care.   Author: Amaryllis Dare, MD 08/15/2023 12:57 PM  For on call review [email or web address].

## 2023-08-15 NOTE — Consult Note (Signed)
 [facility] CLINIC CARDIOLOGY CONSULT NOTE       Patient ID: Chelsea Greene MRN: [id number] DOB/AGE: 12/13/1970 52 y.o.  Admit date: 08/14/2023 Referring Physician Dr. Greig Free Primary Physician Alla Amis, MD  Primary Cardiologist None Reason for Consultation chest pain, mildly elevated troponin  HPI: Chelsea Greene is a 53 y.o. female  with a past medical history of hypertension, hypothyroidism, anxiety who presented to the ED on 08/14/2023 for chest pain. Cardiology was consulted for further evaluation.   Patient reports that while she was at the gym Monday evening she did not feel well, decided to use the stationary bike and while doing this she had onset of chest tightness and felt like she could not take a deep breath. She stopped doing this and her symptoms improved. When she woke up yesterday she had recurrence of her symptoms, she reached out to her PCP who advised her to come to the ED for evaluation. Workup in the ED notable for Cr 1.02, K 4.5, hgb 12.9, WBC 0.93.  Troponins trended 23 > 26 > 21.  EKG demonstrated sinus rhythm with PVCs, nonischemic.  She was started on IV heparin  in the ED.  At the time of my evaluation this morning she is resting comfortably in ED stretcher.  States that overall she feels slightly better today.  Reports that she still has mild chest discomfort but overall this is significantly improved.  We discussed her episodes over the last few days in further detail.  Describes the chest tightness as a squeezing sensation.  This initially came on with activity and resolved with rest but then yesterday was present at rest.  She states that she has also had an associated headache since symptoms started.  She states that yesterday she also noticed pain in her left shoulder and her mid back.  Denies any prior episodes that were similar.  She also denies any palpitation symptoms.  Review of systems complete and found to be negative unless listed above    Past  Medical History:  Diagnosis Date   Anxiety    Hypertension    Medical history non-contributory    Varicose veins of both lower extremities     Past Surgical History:  Procedure Laterality Date   TONSILLECTOMY      (Not in a hospital admission)  Social History   Socioeconomic History   Marital status: Married    Spouse name: Not on file   Number of children: Not on file   Years of education: Not on file   Highest education level: Not on file  Occupational History   Not on file  Tobacco Use   Smoking status: Never   Smokeless tobacco: Never  Vaping Use   Vaping status: Never Used  Substance and Sexual Activity   Alcohol use: Yes    Comment: ocassionally   Drug use: No   Sexual activity: Not Currently  Other Topics Concern   Not on file  Social History Narrative   Not on file   Social Drivers of Health   Financial Resource Strain: Low Risk  (06/21/2023)   Received from [facility]   Overall Financial Resource Strain (CARDIA)    Difficulty of Paying Living Expenses: Not hard at all  Food Insecurity: No Food Insecurity (06/21/2023)   Received from [facility]   Hunger Vital Sign    Worried About Running Out of Food in the Last Year: Never true    Ran Out of Food  in the Last Year: Never true  Transportation Needs: No Transportation Needs (06/21/2023)   Received from [facility] - Transportation    In the past 12 months, has lack of transportation kept you from medical appointments or from getting medications?: No    Lack of Transportation (Non-Medical): No  Physical Activity: Not on file  Stress: Not on file  Social Connections: Not on file  Intimate Partner Violence: Not on file    Family History  Problem Relation Age of Onset   Heart attack Mother    Asthma Mother    Cancer Father    Aneurysm Sister      Vitals:   08/15/23 0600 08/15/23 0718 08/15/23 0730 08/15/23 0923  BP: 97/69 111/69  112/73 (!) 130/90  Pulse: 63 68 73 81  Resp: 14 20 17 17   Temp:  (!) 97.2 F (36.2 C)    TempSrc:  Oral    SpO2: 99% 100% 97% 98%  Weight:      Height:        PHYSICAL EXAM General: Well appearing female, well nourished, in no acute distress. HEENT: Normocephalic and atraumatic. Neck: No JVD.  Lungs: Normal respiratory effort on room air. Clear bilaterally to auscultation. No wheezes, crackles, rhonchi.  Heart: HRRR. Normal S1 and S2 without gallops or murmurs.  Abdomen: Non-distended appearing.  Msk: Normal strength and tone for age. Extremities: Warm and well perfused. No clubbing, cyanosis. No edema.  Neuro: Alert and oriented X 3. Psych: Answers questions appropriately.   Labs: Basic Metabolic Panel: Recent Labs    08/14/23 1545 08/15/23 0551  NA 138 140  K 4.5 3.5  CL 99 102  CO2 29 27  GLUCOSE 113* 92  BUN 20 17  CREATININE 1.02* 0.93  CALCIUM  10.0 8.9  MG  --  1.8   Liver Function Tests: No results for input(s): AST, ALT, ALKPHOS, BILITOT, PROT, ALBUMIN in the last 72 hours. No results for input(s): LIPASE, AMYLASE in the last 72 hours. CBC: Recent Labs    08/14/23 1545 08/15/23 0551  WBC 9.2 5.8  HGB 12.9 11.1*  HCT 39.1 33.1*  MCV 91.6 89.5  PLT 319 271   Cardiac Enzymes: Recent Labs    08/14/23 1545 08/14/23 1751 08/15/23 0551  TROPONINIHS 23* 26* 21*   BNP: No results for input(s): BNP in the last 72 hours. D-Dimer: No results for input(s): DDIMER in the last 72 hours. Hemoglobin A1C: No results for input(s): HGBA1C in the last 72 hours. Fasting Lipid Panel: No results for input(s): CHOL, HDL, LDLCALC, TRIG, CHOLHDL, LDLDIRECT in the last 72 hours. Thyroid Function Tests: No results for input(s): TSH, T4TOTAL, T3FREE, THYROIDAB in the last 72 hours.  Invalid input(s): FREET3 Anemia Panel: No results for input(s): VITAMINB12, FOLATE, FERRITIN, TIBC, IRON, RETICCTPCT in the last  72 hours.   Radiology: ECHOCARDIOGRAM COMPLETE Result Date: 08/15/2023    ECHOCARDIOGRAM REPORT   Patient Name:   Chelsea Greene Date of Exam: 08/14/2023 Medical Rec #:  [id number]       Height:       63.0 in Accession #:    [id number]      Weight:       150.0 lb Date of Birth:  [birth year]       BSA:          1.711 m Patient Age:    52 years        BP:  144/80 mmHg Patient Gender: F               HR:           109 bpm. Exam Location:  [facility] Procedure: 2D Echo, Cardiac Doppler and Color Doppler Indications:     R07.9 Chest pain                  I21.4 NSTEMI  History:         Patient has no prior history of Echocardiogram examinations.                  Risk Factors:Hypertension.  Sonographer:     Carl Coma RDCS Referring Phys:  [id number] AMY N COX Diagnosing Phys: Marsa Dooms MD IMPRESSIONS  1. Left ventricular ejection fraction, by estimation, is 60 to 65%. The left ventricle has normal function. The left ventricle has no regional wall motion abnormalities. Left ventricular diastolic parameters were normal.  2. Right ventricular systolic function is normal. The right ventricular size is normal.  3. The mitral valve is normal in structure. Trivial mitral valve regurgitation. No evidence of mitral stenosis.  4. The aortic valve is normal in structure. Aortic valve regurgitation is not visualized. No aortic stenosis is present.  5. The inferior vena cava is normal in size with greater than 50% respiratory variability, suggesting right atrial pressure of 3 mmHg. FINDINGS  Left Ventricle: Left ventricular ejection fraction, by estimation, is 60 to 65%. The left ventricle has normal function. The left ventricle has no regional wall motion abnormalities. The left ventricular internal cavity size was normal in size. There is  no left ventricular hypertrophy. Left ventricular diastolic parameters were normal. Right Ventricle: The right ventricular size is normal. No increase in right ventricular  wall thickness. Right ventricular systolic function is normal. Left Atrium: Left atrial size was normal in size. Right Atrium: Right atrial size was normal in size. Pericardium: There is no evidence of pericardial effusion. Mitral Valve: The mitral valve is normal in structure. Trivial mitral valve regurgitation. No evidence of mitral valve stenosis. Tricuspid Valve: The tricuspid valve is normal in structure. Tricuspid valve regurgitation is trivial. No evidence of tricuspid stenosis. Aortic Valve: The aortic valve is normal in structure. Aortic valve regurgitation is not visualized. No aortic stenosis is present. Pulmonic Valve: The pulmonic valve was normal in structure. Pulmonic valve regurgitation is not visualized. No evidence of pulmonic stenosis. Aorta: The aortic root is normal in size and structure. Venous: The inferior vena cava is normal in size with greater than 50% respiratory variability, suggesting right atrial pressure of 3 mmHg. IAS/Shunts: No atrial level shunt detected by color flow Doppler.  LEFT VENTRICLE PLAX 2D LVIDd:         3.90 cm   Diastology LVIDs:         2.80 cm   LV e' medial:    11.20 cm/s LV PW:         0.90 cm   LV E/e' medial:  8.4 LV IVS:        0.80 cm   LV e' lateral:   14.17 cm/s LVOT diam:     2.10 cm   LV E/e' lateral: 6.6 LV SV:         81 LV SV Index:   48 LVOT Area:     3.46 cm  RIGHT VENTRICLE             IVC RV Basal diam:  3.00 cm  IVC diam: 1.70 cm RV S prime:     18.75 cm/s TAPSE (M-mode): 2.7 cm LEFT ATRIUM             Index        RIGHT ATRIUM          Index LA diam:        3.60 cm 2.10 cm/m   RA Area:     8.23 cm LA Vol (A2C):   33.6 ml 19.64 ml/m  RA Volume:   16.60 ml 9.70 ml/m LA Vol (A4C):   21.0 ml 12.27 ml/m LA Biplane Vol: 29.1 ml 17.01 ml/m  AORTIC VALVE LVOT Vmax:   138.33 cm/s LVOT Vmean:  88.533 cm/s LVOT VTI:    0.235 m  AORTA Ao Root diam: 3.20 cm MITRAL VALVE MV Area (PHT): 4.21 cm    SHUNTS MV Decel Time: 180 msec    Systemic VTI:  0.24 m  MV E velocity: 93.80 cm/s  Systemic Diam: 2.10 cm MV A velocity: 84.75 cm/s MV E/A ratio:  1.11 Marsa Dooms MD Electronically signed by Marsa Dooms MD Signature Date/Time: 08/15/2023/8:03:58 AM    Final    CT Angio Chest Aorta w/CM &/OR wo/CM Result Date: 08/14/2023 CLINICAL DATA:  Acute aortic syndrome suspected. EXAM: CT ANGIOGRAPHY CHEST WITH CONTRAST TECHNIQUE: Multidetector CT imaging of the chest was performed using the standard protocol during bolus administration of intravenous contrast. Multiplanar CT image reconstructions and MIPs were obtained to evaluate the vascular anatomy. RADIATION DOSE REDUCTION: This exam was performed according to the departmental dose-optimization program which includes automated exposure control, adjustment of the mA and/or kV according to patient size and/or use of iterative reconstruction technique. CONTRAST:  80mL OMNIPAQUE  IOHEXOL  350 MG/ML SOLN COMPARISON:  Chest CT 09/12/2013. FINDINGS: Cardiovascular: Ascending aorta is dilated measuring up to 4 cm. Heart is normal in size. There is adequate opacification of the pulmonary arteries to the segmental level. There is no evidence for pulmonary embolism. Mediastinum/Nodes: No enlarged mediastinal, hilar, or axillary lymph nodes. Thyroid gland, trachea, and esophagus demonstrate no significant findings. Lungs/Pleura: Subpleural nodular densities and fissural nodular densities measuring 3 mm or less are unchanged from 2016 compatible with benign etiology. Lungs are otherwise clear. There is no pleural effusion or pneumothorax Upper Abdomen: No acute abnormality. Musculoskeletal: Bilateral breast implants are present. No acute fractures are seen. Review of the MIP images confirms the above findings. IMPRESSION: 1. No evidence for pulmonary embolism. 2. No acute cardiopulmonary process. 3. 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 Recommend follow-up CT/MR every 12 months and vascular  consultation. Electronically Signed   By: Greig Pique M.D.   On: 08/14/2023 18:39   DG Chest 2 View Result Date: 08/14/2023 CLINICAL DATA:  Chest pain and tightness radiating to the left shoulder and back. EXAM: CHEST - 2 VIEW COMPARISON:  CT 09/12/2013 FINDINGS: Heart size is normal. Mediastinal shadows are normal. The lungs are clear. No bronchial thickening. No infiltrate, mass, effusion or collapse. Pulmonary vascularity is normal. No bony abnormality. IMPRESSION: Normal chest. Electronically Signed   By: Oneil Officer M.D.   On: 08/14/2023 16:16    ECHO as above  TELEMETRY reviewed by me 08/15/2023: sinus rhythm rate 70s  EKG reviewed by me: sinus rhythm PVCs rate 89 bpm, nonacute  Data reviewed by me 08/15/2023: last 24h vitals tele labs imaging I/O ED provider note, admission H&P  Principal Problem:   NSTEMI (non-ST elevated myocardial infarction) [facility]) Active Problems:  Chest pain   Essential hypertension   Hyperglycemia   Hypothyroidism   Depression    ASSESSMENT AND PLAN:  RIHAM POLYAKOV is a 53 y.o. female  with a past medical history of hypertension, hypothyroidism, anxiety who presented to the ED on 08/14/2023 for chest pain. Cardiology was consulted for further evaluation.   # Chest pain # Elevated troponins Patient with episode of chest tightness Monday night while at gym. Resolved with rest then recurred yesterday and persisted. Troponins 23 > 26 > 21. EKG with NSR and PVCs, nonacute. Echo yesterday demonstrated preserved EF, no WMAs. -Continue heparin  infusion for now -Will start metoprolol  succinate 25 mg daily.  -Plan for coronary CTA today for further evaluation.    TIMI Risk Score for Unstable Angina or Non-ST Elevation MI:   The patient's TIMI risk score is 2, which indicates a 8% risk of all cause mortality, new or recurrent myocardial infarction or need for urgent revascularization in the next 14 days.   This patient's plan of care was discussed and created  with Dr. Ammon and he is in agreement.  Signed: Danita Bloch, PA-C  08/15/2023, 10:16 AM [facility] Cardiology

## 2023-08-15 NOTE — Progress Notes (Signed)

## 2023-08-16 ENCOUNTER — Encounter: Payer: Self-pay | Admitting: Internal Medicine

## 2023-08-16 ENCOUNTER — Other Ambulatory Visit: Payer: Self-pay

## 2023-08-16 ENCOUNTER — Encounter
Admission: EM | Disposition: A | Discharge status: Home / Self Care | Payer: Self-pay | Source: Home / Self Care | Attending: Internal Medicine

## 2023-08-16 DIAGNOSIS — I10 Essential (primary) hypertension: Secondary | ICD-10-CM | POA: Diagnosis not present

## 2023-08-16 DIAGNOSIS — I200 Unstable angina: Secondary | ICD-10-CM

## 2023-08-16 DIAGNOSIS — F32A Depression, unspecified: Secondary | ICD-10-CM | POA: Diagnosis not present

## 2023-08-16 DIAGNOSIS — I214 Non-ST elevation (NSTEMI) myocardial infarction: Secondary | ICD-10-CM | POA: Diagnosis not present

## 2023-08-16 HISTORY — PX: CORONARY STENT INTERVENTION: CATH118234

## 2023-08-16 HISTORY — PX: LEFT HEART CATH AND CORONARY ANGIOGRAPHY: CATH118249

## 2023-08-16 LAB — CBC
HCT: 34.2 % — ABNORMAL LOW (ref 36.0–46.0)
Hemoglobin: 11.4 g/dL — ABNORMAL LOW (ref 12.0–15.0)
MCH: 29.6 pg (ref 26.0–34.0)
MCHC: 33.3 g/dL (ref 30.0–36.0)
MCV: 88.8 fL (ref 80.0–100.0)
Platelets: 278 10*3/uL (ref 150–400)
RBC: 3.85 MIL/uL — ABNORMAL LOW (ref 3.87–5.11)
RDW: 12.7 % (ref 11.5–15.5)
WBC: 6.1 10*3/uL (ref 4.0–10.5)
nRBC: 0 % (ref 0.0–0.2)

## 2023-08-16 LAB — POCT ACTIVATED CLOTTING TIME
Activated Clotting Time: 285 s
Activated Clotting Time: 291 s

## 2023-08-16 LAB — URINE DRUG SCREEN, QUALITATIVE (ARMC ONLY)
Amphetamines, Ur Screen: NOT DETECTED
Barbiturates, Ur Screen: NOT DETECTED
Benzodiazepine, Ur Scrn: NOT DETECTED
Cannabinoid 50 Ng, Ur ~~LOC~~: NOT DETECTED
Cocaine Metabolite,Ur ~~LOC~~: NOT DETECTED
MDMA (Ecstasy)Ur Screen: NOT DETECTED
Methadone Scn, Ur: NOT DETECTED
Opiate, Ur Screen: NOT DETECTED
Phencyclidine (PCP) Ur S: NOT DETECTED
Tricyclic, Ur Screen: NOT DETECTED

## 2023-08-16 LAB — BASIC METABOLIC PANEL
Anion gap: 9 (ref 5–15)
BUN: 15 mg/dL (ref 6–20)
CO2: 26 mmol/L (ref 22–32)
Calcium: 8.2 mg/dL — ABNORMAL LOW (ref 8.9–10.3)
Chloride: 101 mmol/L (ref 98–111)
Creatinine, Ser: 0.91 mg/dL (ref 0.44–1.00)
GFR, Estimated: >60 mL/min (ref >60)
Glucose, Bld: 92 mg/dL (ref 70–99)
Potassium: 4 mmol/L (ref 3.5–5.1)
Sodium: 136 mmol/L (ref 135–145)

## 2023-08-16 LAB — SURGICAL PCR SCREEN
MRSA, PCR: NEGATIVE
Staphylococcus aureus: NEGATIVE

## 2023-08-16 LAB — HEPARIN LEVEL (UNFRACTIONATED): Heparin Unfractionated: 0.51 [IU]/mL (ref 0.30–0.70)

## 2023-08-16 LAB — HEMOGLOBIN A1C
Hgb A1c MFr Bld: 5.2 % (ref 4.8–5.6)
Mean Plasma Glucose: 103 mg/dL

## 2023-08-16 SURGERY — LEFT HEART CATH AND CORONARY ANGIOGRAPHY
Anesthesia: Moderate Sedation

## 2023-08-16 MED ORDER — ROSUVASTATIN CALCIUM 10 MG PO TABS
40.0000 mg | ORAL_TABLET | Freq: Every day | ORAL | Status: DC
  Administered 2023-08-17: 40 mg via ORAL
  Filled 2023-08-16: qty 4
  Filled 2023-08-16: qty 2

## 2023-08-16 MED ORDER — PRASUGREL HCL 10 MG PO TABS
ORAL_TABLET | ORAL | Status: DC | PRN
  Administered 2023-08-16: 60 mg via ORAL

## 2023-08-16 MED ORDER — HEPARIN (PORCINE) IN NACL 1000-0.9 UT/500ML-% IV SOLN
INTRAVENOUS | Status: AC
  Filled 2023-08-16: qty 1000

## 2023-08-16 MED ORDER — VERAPAMIL HCL 2.5 MG/ML IV SOLN
INTRAVENOUS | Status: DC | PRN
  Administered 2023-08-16 (×4): 2.5 mg via INTRA_ARTERIAL

## 2023-08-16 MED ORDER — VERAPAMIL HCL 2.5 MG/ML IV SOLN
INTRAVENOUS | Status: AC
  Filled 2023-08-16: qty 2

## 2023-08-16 MED ORDER — MIDAZOLAM HCL 2 MG/2ML IJ SOLN
INTRAMUSCULAR | Status: AC
  Filled 2023-08-16: qty 2

## 2023-08-16 MED ORDER — SODIUM CHLORIDE 0.9% FLUSH
3.0000 mL | Freq: Two times a day (BID) | INTRAVENOUS | Status: DC
  Administered 2023-08-16 – 2023-08-17 (×2): 3 mL via INTRAVENOUS

## 2023-08-16 MED ORDER — LIDOCAINE HCL 1 % IJ SOLN
INTRAMUSCULAR | Status: AC
  Filled 2023-08-16: qty 20

## 2023-08-16 MED ORDER — METOPROLOL SUCCINATE ER 25 MG PO TB24
12.5000 mg | ORAL_TABLET | Freq: Every day | ORAL | Status: DC
  Administered 2023-08-17: 12.5 mg via ORAL
  Filled 2023-08-16: qty 1
  Filled 2023-08-16: qty 0.5

## 2023-08-16 MED ORDER — SODIUM CHLORIDE 0.9% FLUSH: 3.0000 mL | INTRAVENOUS | Status: DC | PRN

## 2023-08-16 MED ORDER — LIDOCAINE HCL (PF) 1 % IJ SOLN
INTRAMUSCULAR | Status: DC | PRN
  Administered 2023-08-16: 2 mL

## 2023-08-16 MED ORDER — HEPARIN SODIUM (PORCINE) 1000 UNIT/ML IJ SOLN
INTRAMUSCULAR | Status: DC | PRN
  Administered 2023-08-16: 6000 [IU] via INTRAVENOUS
  Administered 2023-08-16: 2000 [IU] via INTRAVENOUS
  Administered 2023-08-16: 3000 [IU] via INTRAVENOUS
  Administered 2023-08-16: 1500 [IU] via INTRAVENOUS

## 2023-08-16 MED ORDER — SODIUM CHLORIDE 0.9 % WEIGHT BASED INFUSION: 1.0000 mL/kg/h | INTRAVENOUS | Status: DC

## 2023-08-16 MED ORDER — SODIUM CHLORIDE 0.9 % WEIGHT BASED INFUSION
3.0000 mL/kg/h | INTRAVENOUS | Status: AC
  Administered 2023-08-16: 3 mL/kg/h via INTRAVENOUS

## 2023-08-16 MED ORDER — HEPARIN SODIUM (PORCINE) 1000 UNIT/ML IJ SOLN
INTRAMUSCULAR | Status: AC
  Filled 2023-08-16: qty 10

## 2023-08-16 MED ORDER — LABETALOL HCL 5 MG/ML IV SOLN: 10.0000 mg | INTRAVENOUS | Status: AC | PRN

## 2023-08-16 MED ORDER — FENTANYL CITRATE (PF) 100 MCG/2ML IJ SOLN
INTRAMUSCULAR | Status: AC
  Filled 2023-08-16: qty 2

## 2023-08-16 MED ORDER — HEPARIN SODIUM (PORCINE) 1000 UNIT/ML IJ SOLN
INTRAMUSCULAR | Status: AC
Start: 2023-08-16 — End: ?
  Filled 2023-08-16: qty 10

## 2023-08-16 MED ORDER — MIDAZOLAM HCL 2 MG/2ML IJ SOLN
INTRAMUSCULAR | Status: DC | PRN
  Administered 2023-08-16 (×2): 1 mg via INTRAVENOUS

## 2023-08-16 MED ORDER — ACETAMINOPHEN 325 MG PO TABS
ORAL_TABLET | ORAL | Status: AC
  Filled 2023-08-16: qty 2

## 2023-08-16 MED ORDER — PRASUGREL HCL 10 MG PO TABS
10.0000 mg | ORAL_TABLET | Freq: Every day | ORAL | Status: DC
  Administered 2023-08-17: 10 mg via ORAL
  Filled 2023-08-16: qty 1

## 2023-08-16 MED ORDER — PRASUGREL HCL 10 MG PO TABS
ORAL_TABLET | ORAL | Status: AC
  Filled 2023-08-16: qty 6

## 2023-08-16 MED ORDER — ASPIRIN 81 MG PO CHEW
81.0000 mg | CHEWABLE_TABLET | Freq: Every day | ORAL | Status: DC
  Administered 2023-08-17: 81 mg via ORAL
  Filled 2023-08-16: qty 1

## 2023-08-16 MED ORDER — SODIUM CHLORIDE 0.9 % IV SOLN: 250.0000 mL | INTRAVENOUS | Status: DC | PRN

## 2023-08-16 MED ORDER — ASPIRIN 81 MG PO CHEW
CHEWABLE_TABLET | ORAL | Status: DC | PRN
  Administered 2023-08-16: 243 mg via ORAL

## 2023-08-16 MED ORDER — SODIUM CHLORIDE 0.9 % WEIGHT BASED INFUSION: 1.0000 mL/kg/h | INTRAVENOUS | Status: AC

## 2023-08-16 MED ORDER — SODIUM CHLORIDE 0.9 % WEIGHT BASED INFUSION
150.0000 mL/h | INTRAVENOUS | Status: DC
  Administered 2023-08-16: 150 mL/h via INTRAVENOUS

## 2023-08-16 MED ORDER — ACETAMINOPHEN 325 MG PO TABS
650.0000 mg | ORAL_TABLET | ORAL | Status: DC | PRN
  Administered 2023-08-16: 650 mg via ORAL

## 2023-08-16 MED ORDER — HEPARIN (PORCINE) IN NACL 2000-0.9 UNIT/L-% IV SOLN
INTRAVENOUS | Status: DC | PRN
  Administered 2023-08-16: 1000 mL

## 2023-08-16 MED ORDER — FENTANYL CITRATE (PF) 100 MCG/2ML IJ SOLN
INTRAMUSCULAR | Status: DC | PRN
  Administered 2023-08-16 (×2): 25 ug via INTRAVENOUS
  Administered 2023-08-16: 50 ug via INTRAVENOUS

## 2023-08-16 MED ORDER — SODIUM CHLORIDE 0.9 % WEIGHT BASED INFUSION: 3.0000 mL/kg/h | INTRAVENOUS | Status: DC

## 2023-08-16 MED ORDER — SODIUM CHLORIDE 0.9 % WEIGHT BASED INFUSION: 150.0000 mL/h | INTRAVENOUS | Status: DC

## 2023-08-16 MED ORDER — SODIUM CHLORIDE 0.9 % WEIGHT BASED INFUSION
1.0000 mL/kg/h | INTRAVENOUS | Status: DC
  Administered 2023-08-16: 1 mL/kg/h via INTRAVENOUS

## 2023-08-16 MED ORDER — HYDRALAZINE HCL 20 MG/ML IJ SOLN: 10.0000 mg | INTRAMUSCULAR | Status: AC | PRN

## 2023-08-16 MED ORDER — ASPIRIN 81 MG PO CHEW
CHEWABLE_TABLET | ORAL | Status: AC
  Filled 2023-08-16: qty 3

## 2023-08-16 MED ORDER — IOHEXOL 300 MG/ML  SOLN
INTRAMUSCULAR | Status: DC | PRN
  Administered 2023-08-16: 138 mL

## 2023-08-16 SURGICAL SUPPLY — 22 items
BALLN TREK RX 2.5X12 (BALLOONS) ×1
BALLN ~~LOC~~ TREK NEO RX 3.0X12 (BALLOONS) ×1
BALLOON TREK RX 2.5X12 (BALLOONS) IMPLANT
BALLOON ~~LOC~~ TREK NEO RX 3.0X12 (BALLOONS) IMPLANT
CATH 5FR JL3.5 JR4 ANG PIG MP (CATHETERS) IMPLANT
CATH INFINITI 5FR JL4 (CATHETERS) IMPLANT
CATH LAUNCHER 6FR JR4 (CATHETERS) IMPLANT
CATH VISTA GUIDE 6FR JR4 MULPK (CATHETERS) IMPLANT
CATH VISTA GUIDE 6FR JR4 SH (CATHETERS) ×1
DEVICE RAD TR BAND REGULAR (VASCULAR PRODUCTS) IMPLANT
DRAPE BRACHIAL (DRAPES) IMPLANT
GLIDESHEATH SLEND SS 6F .021 (SHEATH) IMPLANT
GUIDEWIRE INQWIRE 1.5J.035X260 (WIRE) IMPLANT
INQWIRE 1.5J .035X260CM (WIRE) ×1
KIT ENCORE 26 ADVANTAGE (KITS) IMPLANT
PACK CARDIAC CATH (CUSTOM PROCEDURE TRAY) ×1 IMPLANT
PROTECTION STATION PRESSURIZED (MISCELLANEOUS) ×1
SET ATX-X65L (MISCELLANEOUS) IMPLANT
STATION PROTECTION PRESSURIZED (MISCELLANEOUS) IMPLANT
STENT ONYX FRONTIER 3.0X15 (Permanent Stent) IMPLANT
TUBING CIL FLEX 10 FLL-RA (TUBING) IMPLANT
WIRE ASAHI PROWATER 180CM (WIRE) IMPLANT

## 2023-08-16 NOTE — Consult Note (Signed)
 PHARMACY - ANTICOAGULATION CONSULT NOTE  Pharmacy Consult for IV Heparin  Indication: chest pain/ACS  Patient Measurements: Height: 5' 3 (160 cm) Weight: 68 kg (150 lb) IBW/kg (Calculated) : 52.4 Heparin  Dosing Weight: 66.3 kg  Labs: Recent Labs    08/14/23 1545 08/14/23 1545 08/14/23 1751 08/15/23 0551 08/15/23 1412 08/15/23 2301 08/16/23 0444  HGB 12.9  --   --  11.1*  --   --  11.4*  HCT 39.1  --   --  33.1*  --   --  34.2*  PLT 319  --   --  271  --   --  278  APTT  --   --  27  --   --   --   --   LABPROT  --   --  13.1  --   --   --   --   INR  --   --  1.0  --   --   --   --   HEPARINUNFRC  --    < >  --  0.18* 0.49 0.46 0.51  CREATININE 1.02*  --   --  0.93  --   --   --   TROPONINIHS 23*  --  26* 21*  --   --   --    < > = values in this interval not displayed.   Estimated Creatinine Clearance: 64.7 mL/min (by C-G formula based on SCr of 0.93 mg/dL).  Medical History: Past Medical History:  Diagnosis Date   Anxiety    Hypertension    Medical history non-contributory    Varicose veins of both lower extremities    Medications:  Medication reconciliation is pending. No apparent anticoagulation prior to admission  Assessment: Patient is a 53 y/o F with medical history as above presenting to the ED with chest pain and tightness with radiation to left shoulder and back. There is concern for NSTEMI. Pharmacy consulted to initiate and manage heparin  infusion.   Baseline aPTT and PT-INR are pending. Baseline CBC within normal limits.  Goal of Therapy:  Heparin  level 0.3-0.7 units/ml Monitor platelets by anticoagulation protocol: Yes  1/8 0020  Received call from RN.  Just took over pt.  Found heparin  line disconnected with liquid on floor. Unsure how long line was disconnected and unsure if pt was even given initial bolus.  Instructed RN to reconnect line and continue heparin  infusion at 800 unit/hr.  Will check HL @ 0600. 1/8 0551 HL  0.18, subtherapeutic 1/8  1412 HL 0.49, therapeutic x1 1/8 2301 HL 0.46, therapeutic x 2 1/8 0444 HL 0.51, therapeutic x 3   Plan:  Continue heparin  infusion at 1000 units/hr Recheck confirmatory HL daily w/ AM labs while therapeutic Monitor heparin  levels daily while on heparin  infusion Monitor CBC and signs/symptoms of bleeding  Thank you for involving pharmacy in this patient's care.   Rankin CANDIE Dills, PharmD, [facility] 08/16/2023 6:05 AM

## 2023-08-16 NOTE — Progress Notes (Signed)
 Transition of Care [facility]) - Inpatient Brief Assessment   Patient Details  Name: Chelsea Greene MRN: [id number] Date of Birth: [birth year]  Transition of Care [facility]) CM/SW Contact:    Rjay Revolorio C Kaelan Amble, RN Phone Number: 08/16/2023, 2:45 PM   Clinical Narrative: TOC continuing to follow patient's progress throughout discharge planning.   Transition of Care Asessment: Insurance and Status: Insurance coverage has been reviewed Patient has primary care physician: Yes   Prior level of function:: Independent Prior/Current Home Services: No current home services Social Drivers of Health Review: SDOH reviewed no interventions necessary Readmission risk has been reviewed: Yes Transition of care needs: no transition of care needs at this time

## 2023-08-16 NOTE — Assessment & Plan Note (Signed)
 Due to unstable angina. -Please see management above

## 2023-08-16 NOTE — Assessment & Plan Note (Signed)
 Patient had barely positive troponin with a flat curve, likely unstable angina so NSTEMI ruled out. Echocardiogram without any significant abnormality but CTA  coronary with concern of RCA stenosis. A1c 5.2, lipid panel with mildly elevated total cholesterol at 203, LDL at 121 with goal should be less than 70. Cardiac catheterization with 99% stenosis of RCA s/p PCI, also found to have up to 50% stenosis on other vessels which will be currently treated medically -Patient was started on DAPT with aspirin  and prasugrel  -Started on high intensity statin -Started on metoprolol  -Continue to monitor

## 2023-08-16 NOTE — Progress Notes (Signed)
 Consent for heart cath signed by patient, witnessed by this RN. Placed in physical chart.

## 2023-08-16 NOTE — Progress Notes (Signed)
 Progress Note   Patient: Chelsea Greene DOB: 1970-09-29 DOA: 08/14/2023     2 DOS: the patient was seen and examined on 08/16/2023   Brief hospital course: [name] is a 53 year old female with history of hypertension, anxiety, hypothyroid, hyperlipidemia, who presents to the emergency department for chief concerns of chest pain, chest tightness.  Vitals in the ED showed T 98.2, rr 18, heart rate 78, blood pressure 150/77, SpO2 100% on room air.  Serum sodium is 138, potassium 4.5, chloride 99, bicarb 29, BUN of 20, sCr 1.02, eGFR greater than 60, nonfasting blood glucose 113, WBC 9.2, hemoglobin 12.9, platelets of 319.  HS troponin is 23 and on repeat is 29.  CTA Chest Aorta w/cm or wo/cm: no evidence of pulmonary embolism. No acute cardiopulmonary process. 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   1/8: Vital stable, echocardiogram without any significant abnormality.  Patient did not met criteria for NSTEMI so it ruled out.  Likely unstable angina.  CTA coronary was done by cardiology and there was a concerning RCA stenosis so they are planning to do cardiac cath tomorrow morning.  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  Patient will need at least 78-month of DAPT.  Also found to have stenosis on the left side which will be currently treated medically.  Cardiology also added Crestor , stopped HCTZ and added metoprolol .   Assessment and Plan: * Unstable angina [facility]) Patient had barely positive troponin with a flat curve, likely unstable angina so NSTEMI ruled out. Echocardiogram without any significant abnormality but CTA  coronary with concern of RCA stenosis. A1c 5.2, lipid panel with mildly elevated total cholesterol at 203, LDL at 121 with goal should be less than 70. Cardiac catheterization with 99% stenosis of RCA s/p PCI, also found to have up to  50% stenosis on other vessels which will be currently treated medically -Patient was started on DAPT with aspirin  and prasugrel  -Started on high intensity statin -Started on metoprolol  -Continue to monitor  Chest pain Due to unstable angina. -Please see management above  Depression Home escitalopram  5 mg daily resumed  Hypothyroidism Levothyroxine  88 mcg daily before breakfast resumed  Hyperglycemia CBG within goal.  A1c of 5.2  Essential hypertension Home HCTZ is being switched with metoprolol    Subjective: Patient was seen after the cardiac catheterization.  Denies any pain.  Physical Exam: Vitals:   08/16/23 1145 08/16/23 1200 08/16/23 1215 08/16/23 1230  BP: 109/70 110/71 111/66 95/65  Pulse: (!) 59 62 (!) 57 (!) 55  Resp: 20 (!) 23 16 17   Temp:      TempSrc:      SpO2: 96% 97% 97% 97%  Weight:      Height:       General.  Well-developed lady, in no acute distress. Pulmonary.  Lungs clear bilaterally, normal respiratory effort. CV.  Regular rate and rhythm, no JVD, rub or murmur. Abdomen.  Soft, nontender, nondistended, BS positive. CNS.  Alert and oriented .  No focal neurologic deficit. Extremities.  No edema, no cyanosis, pulses intact and symmetrical. Psychiatry.  Judgment and insight appears normal.    Data Reviewed: Prior data reviewed  Family Communication: Discussed with patient and husband at bedside  Disposition: Status is: Inpatient Remains inpatient appropriate because: Severity of illness  Planned Discharge Destination: Home  DVT prophylaxis.  Heparin  infusion Time spent: 50 minutes  This  record has been created using Conservation officer, historic buildings. Errors have been sought and corrected,but may not always be located. Such creation errors do not reflect on the standard of care.   Author: Amaryllis Dare, MD 08/16/2023 12:50 PM  For on call review [email or web address].

## 2023-08-16 NOTE — Assessment & Plan Note (Signed)
 Home HCTZ is being switched with metoprolol

## 2023-08-16 NOTE — Plan of Care (Signed)
  Problem: Health Behavior/Discharge Planning: Goal: Ability to manage health-related needs will improve Outcome: Progressing   Problem: Clinical Measurements: Goal: Will remain free from infection Outcome: Progressing   Problem: Clinical Measurements: Goal: Respiratory complications will improve Outcome: Progressing   Problem: Clinical Measurements: Goal: Cardiovascular complication will be avoided Outcome: Progressing   Problem: Activity: Goal: Risk for activity intolerance will decrease Outcome: Progressing   Problem: Pain Management: Goal: General experience of comfort will improve Outcome: Progressing

## 2023-08-16 NOTE — Assessment & Plan Note (Signed)
 CBG within goal.  A1c of 5.2

## 2023-08-16 NOTE — Progress Notes (Signed)
 Patient complained of 3/10 left shoulder pain, PRN medication given and provider notified.

## 2023-08-16 NOTE — Progress Notes (Signed)
 [facility] CLINIC CARDIOLOGY PROGRESS NOTE       Patient ID: Chelsea Greene MRN: [id number] DOB/AGE: 08-18-1970 52 y.o.  Admit date: 08/14/2023 Referring Physician Dr. Greig Free Primary Physician Alla Amis, MD  Primary Cardiologist None Reason for Consultation chest pain, mildly elevated troponin  HPI: Chelsea Greene is a 53 y.o. female  with a past medical history of hypertension, hypothyroidism, anxiety who presented to the ED on 08/14/2023 for chest pain. Cardiology was consulted for further evaluation.   Interval history: -Patient feeling well overall this AM, reports some intermittent episodes of chest tightness when she would get up to go to the bathroom.  -BP and HR remain stable, no evidence of arrhythmia on tele.  -Renal function stable this AM. Hgb stable as well.  -Plan for LHC later this AM.   Review of systems complete and found to be negative unless listed above    Past Medical History:  Diagnosis Date   Anxiety    Hypertension    Medical history non-contributory    Varicose veins of both lower extremities     Past Surgical History:  Procedure Laterality Date   TONSILLECTOMY      Medications Prior to Admission  Medication Sig Dispense Refill Last Dose/Taking   hydrochlorothiazide  (HYDRODIURIL ) 12.5 MG tablet Take 12.5 mg by mouth daily.   08/14/2023 Morning   levothyroxine  (SYNTHROID ) 88 MCG tablet Take 88 mcg by mouth daily before breakfast.   08/14/2023 Morning   MIMVEY 1-0.5 MG tablet TAKE 1 TABLET(S) EVERY DAY BY ORAL ROUTE.  12 08/14/2023   ARMOUR THYROID 60 MG tablet       baclofen  (LIORESAL ) 10 MG tablet Take 1 tablet (10 mg total) by mouth 2 (two) times daily. 30 each 0    colchicine (COLCRYS) 0.6 MG tablet Colcrys 0.6 mg tablet      escitalopram  (LEXAPRO ) 10 MG tablet Take 10 mg by mouth daily.      ESTRING  2 MG vaginal ring       hydrocortisone (PROCTOSOL HC) 2.5 % rectal cream Proctosol HC 2.5 % topical cream perineal applicator  APPLY SPARINGLY TO  AFFECTED AREA 2 TO 4 TIMES A DAY      meloxicam (MOBIC) 15 MG tablet meloxicam 15 mg tablet  TAKE 1 TABLET BY MOUTH ONCE A DAY AS NEEDED FOR PAIN TAKE WITH FOOD      phentermine 15 MG capsule phentermine 15 mg capsule  TAKE ONE CAPSULE BY MOUTH EVERY DAY      PIRMELLA 1/35 tablet       progesterone (PROMETRIUM) 100 MG capsule progesterone micronized 100 mg capsule  Take 1 capsule every day by oral route for 90 days.      vitamin B-12 (CYANOCOBALAMIN) 1000 MCG tablet Take by mouth. (Patient not taking: Reported on 08/14/2023)   Not Taking   Social History   Socioeconomic History   Marital status: Married    Spouse name: Not on file   Number of children: Not on file   Years of education: Not on file   Highest education level: Not on file  Occupational History   Not on file  Tobacco Use   Smoking status: Never   Smokeless tobacco: Never  Vaping Use   Vaping status: Never Used  Substance and Sexual Activity   Alcohol use: Yes    Comment: ocassionally   Drug use: No   Sexual activity: Not Currently  Other Topics Concern   Not on file  Social History Narrative  Not on file   Social Drivers of Health   Financial Resource Strain: Low Risk  (06/21/2023)   Received from [facility]   Overall Financial Resource Strain (CARDIA)    Difficulty of Paying Living Expenses: Not hard at all  Food Insecurity: No Food Insecurity (08/15/2023)   Hunger Vital Sign    Worried About Running Out of Food in the Last Year: Never true    Ran Out of Food in the Last Year: Never true  Transportation Needs: No Transportation Needs (08/15/2023)   PRAPARE - Administrator, Civil Service (Medical): No    Lack of Transportation (Non-Medical): No  Physical Activity: Not on file  Stress: Not on file  Social Connections: Not on file  Intimate Partner Violence: Not At Risk (08/15/2023)   Humiliation, Afraid, Rape, and Kick questionnaire    Fear of Current or Ex-Partner: No     Emotionally Abused: No    Physically Abused: No    Sexually Abused: No    Family History  Problem Relation Age of Onset   Heart attack Mother    Asthma Mother    Cancer Father    Aneurysm Sister      Vitals:   08/15/23 2015 08/15/23 2109 08/16/23 0008 08/16/23 0329  BP: 101/73 132/73 107/66 107/68  Pulse: 66 61 62 61  Resp: 17 18 18 16   Temp: 98.6 F (37 C) 98.2 F (36.8 C) 98.2 F (36.8 C) 98 F (36.7 C)  TempSrc: Oral     SpO2: 100% 100% 97% 100%  Weight:      Height:        PHYSICAL EXAM General: Well appearing female, well nourished, in no acute distress resting comfortably in hospital bed with husband present at bedside. HEENT: Normocephalic and atraumatic. Neck: No JVD.  Lungs: Normal respiratory effort on room air. Clear bilaterally to auscultation. No wheezes, crackles, rhonchi.  Heart: HRRR. Normal S1 and S2 without gallops or murmurs.  Abdomen: Non-distended appearing.  Msk: Normal strength and tone for age. Extremities: Warm and well perfused. No clubbing, cyanosis. No edema.  Neuro: Alert and oriented X 3. Psych: Answers questions appropriately.   Labs: Basic Metabolic Panel: Recent Labs    08/15/23 0551 08/16/23 0443  NA 140 136  K 3.5 4.0  CL 102 101  CO2 27 26  GLUCOSE 92 92  BUN 17 15  CREATININE 0.93 0.91  CALCIUM  8.9 8.2*  MG 1.8  --    Liver Function Tests: No results for input(s): AST, ALT, ALKPHOS, BILITOT, PROT, ALBUMIN in the last 72 hours. No results for input(s): LIPASE, AMYLASE in the last 72 hours. CBC: Recent Labs    08/15/23 0551 08/16/23 0444  WBC 5.8 6.1  HGB 11.1* 11.4*  HCT 33.1* 34.2*  MCV 89.5 88.8  PLT 271 278   Cardiac Enzymes: Recent Labs    08/14/23 1545 08/14/23 1751 08/15/23 0551  TROPONINIHS 23* 26* 21*   BNP: No results for input(s): BNP in the last 72 hours. D-Dimer: No results for input(s): DDIMER in the last 72 hours. Hemoglobin A1C: Recent Labs    08/15/23 0551   HGBA1C 5.2   Fasting Lipid Panel: Recent Labs    08/15/23 0551  CHOL 203*  HDL 71  LDLCALC 121*  TRIG 54  CHOLHDL 2.9   Thyroid Function Tests: No results for input(s): TSH, T4TOTAL, T3FREE, THYROIDAB in the last 72 hours.  Invalid input(s): FREET3 Anemia Panel: No results for input(s): VITAMINB12,  FOLATE, FERRITIN, TIBC, IRON, RETICCTPCT in the last 72 hours.   Radiology: CT CORONARY FFR DATA PREP & FLUID ANALYSIS Result Date: 08/15/2023 EXAM: CT FFR ANALYSIS CLINICAL DATA:  Abnormal CCTA FINDINGS: FFRct analysis was performed on the original cardiac CT angiogram dataset. Diagrammatic representation of the FFRct analysis is provided in a separate PDF document in PACS. This dictation was created using the PDF document and an interactive 3D model of the results. 3D model is not available in the EMR/PACS. Normal FFR range is >0.80. 1. Left Main:  No significant stenosis. 2. LAD: No significant stenosis.  FFRct in mid segment 0.81 3. LCX: No significant stenosis.  FFRct 0.96 4. RCA: significant stenosis in mid RCA.  FFRct 0.59 IMPRESSION: 1. CT FFR analysis showed significant stenosis in the mid RCA. FFRct 0.59. 2.  Cardiac catheterization recommended. Electronically Signed   By: Redell Cave M.D.   On: 08/15/2023 14:20   CT CORONARY MORPH W/CTA COR W/SCORE W/CA W/CM &/OR WO/CM Addendum Date: 08/15/2023 ADDENDUM REPORT: 08/15/2023 13:25 EXAM: OVER-READ INTERPRETATION  CT CHEST The following report is an over-read performed by radiologist Dr. Vanetta Mortimer [facility] on 08/15/2023. This over-read does not include interpretation of cardiac or coronary anatomy or pathology. The coronary CTA interpretation by the cardiologist is attached. COMPARISON:  Chest CT dated 08/14/2023. FINDINGS: The visualized thoracic aorta and central pulmonary arteries appear unremarkable. No hilar or mediastinal adenopathy noted. The esophagus is grossly unremarkable as  visualized. The visualized lungs are clear. No acute findings in the visualized upper abdomen. Bilateral breast implants.  The osseous structures are intact. IMPRESSION: No acute or significant extracardiac findings. Electronically Signed   By: Vanetta Chou M.D.   On: 08/15/2023 13:25   Result Date: 08/15/2023 CLINICAL DATA:  Chest pain EXAM: Cardiac/Coronary  CTA TECHNIQUE: The patient was scanned on a Siemens Somatom scanner. : A retrospective scan was triggered in the ascending thoracic aorta. Axial non-contrast 3 mm slices were carried out through the heart. The data set was analyzed on a dedicated work station and scored using the Agatson method. Gantry rotation speed was 66 msecs and collimation was .6 mm. 5 mg of ivabradine  and 0.8 mg of sl NTG was given. The 3D data set was reconstructed in 5% intervals of the 60-95 % of the R-R cycle. Diastolic phases were analyzed on a dedicated work station using MPR, MIP and VRT modes. The patient received 80 cc of contrast. FINDINGS: Aorta:  Normal size.  No calcifications.  No dissection. Aortic Valve:  Trileaflet.  No calcifications. Coronary Arteries:  Normal coronary origin.  Right dominance. RCA is a dominant artery. There is non calcified plaque in the mid segment causing severe stenosis (>70%). Left main gives rise to LAD and LCX arteries. LM has no disease. LAD has calcified and non calcified plaque in the proximal-mid segment causing moderate stenosis (50-69%) . LCX is a non-dominant artery.  There is no plaque. Other findings: Normal pulmonary vein drainage into the left atrium. Normal left atrial appendage without a thrombus. Normal size of the pulmonary artery. IMPRESSION: 1. Coronary calcium  score of 258. This was 99th percentile for age and sex matched control. 2. Normal coronary origin with right dominance. 3. 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 4. Moderate proximal-mid LAD stenosis (50-69%). 5. CAD-RADS 4 Severe stenosis. (70-99% or > 50% left main).  Cardiac catheterization is recommended. Consider symptom-guided anti-ischemic pharmacotherapy as well as risk factor modification per guideline directed care. 6. Additional analysis with CT FFR will  be submitted and reported separately. Electronically Signed: By: Redell Cave M.D. On: 08/15/2023 12:21   ECHOCARDIOGRAM COMPLETE Result Date: 08/15/2023    ECHOCARDIOGRAM REPORT   Patient Name:   Chelsea Greene Date of Exam: 08/14/2023 Medical Rec #:  [id number]       Height:       63.0 in Accession #:    [id number]      Weight:       150.0 lb Date of Birth:  1971-01-13       BSA:          1.711 m Patient Age:    52 years        BP:           144/80 mmHg Patient Gender: F               HR:           109 bpm. Exam Location:  [facility] Procedure: 2D Echo, Cardiac Doppler and Color Doppler Indications:     R07.9 Chest pain                  I21.4 NSTEMI  History:         Patient has no prior history of Echocardiogram examinations.                  Risk Factors:Hypertension.  Sonographer:     Carl Coma RDCS Referring Phys:  [id number] AMY N COX Diagnosing Phys: Marsa Dooms MD IMPRESSIONS  1. Left ventricular ejection fraction, by estimation, is 60 to 65%. The left ventricle has normal function. The left ventricle has no regional wall motion abnormalities. Left ventricular diastolic parameters were normal.  2. Right ventricular systolic function is normal. The right ventricular size is normal.  3. The mitral valve is normal in structure. Trivial mitral valve regurgitation. No evidence of mitral stenosis.  4. The aortic valve is normal in structure. Aortic valve regurgitation is not visualized. No aortic stenosis is present.  5. The inferior vena cava is normal in size with greater than 50% respiratory variability, suggesting right atrial pressure of 3 mmHg. FINDINGS  Left Ventricle: Left ventricular ejection fraction, by estimation, is 60 to 65%. The left ventricle has normal function. The left ventricle  has no regional wall motion abnormalities. The left ventricular internal cavity size was normal in size. There is  no left ventricular hypertrophy. Left ventricular diastolic parameters were normal. Right Ventricle: The right ventricular size is normal. No increase in right ventricular wall thickness. Right ventricular systolic function is normal. Left Atrium: Left atrial size was normal in size. Right Atrium: Right atrial size was normal in size. Pericardium: There is no evidence of pericardial effusion. Mitral Valve: The mitral valve is normal in structure. Trivial mitral valve regurgitation. No evidence of mitral valve stenosis. Tricuspid Valve: The tricuspid valve is normal in structure. Tricuspid valve regurgitation is trivial. No evidence of tricuspid stenosis. Aortic Valve: The aortic valve is normal in structure. Aortic valve regurgitation is not visualized. No aortic stenosis is present. Pulmonic Valve: The pulmonic valve was normal in structure. Pulmonic valve regurgitation is not visualized. No evidence of pulmonic stenosis. Aorta: The aortic root is normal in size and structure. Venous: The inferior vena cava is normal in size with greater than 50% respiratory variability, suggesting right atrial pressure of 3 mmHg. IAS/Shunts: No atrial level shunt detected by color flow Doppler.  LEFT VENTRICLE PLAX 2D LVIDd:  3.90 cm   Diastology LVIDs:         2.80 cm   LV e' medial:    11.20 cm/s LV PW:         0.90 cm   LV E/e' medial:  8.4 LV IVS:        0.80 cm   LV e' lateral:   14.17 cm/s LVOT diam:     2.10 cm   LV E/e' lateral: 6.6 LV SV:         81 LV SV Index:   48 LVOT Area:     3.46 cm  RIGHT VENTRICLE             IVC RV Basal diam:  3.00 cm     IVC diam: 1.70 cm RV S prime:     18.75 cm/s TAPSE (M-mode): 2.7 cm LEFT ATRIUM             Index        RIGHT ATRIUM          Index LA diam:        3.60 cm 2.10 cm/m   RA Area:     8.23 cm LA Vol (A2C):   33.6 ml 19.64 ml/m  RA Volume:   16.60 ml 9.70  ml/m LA Vol (A4C):   21.0 ml 12.27 ml/m LA Biplane Vol: 29.1 ml 17.01 ml/m  AORTIC VALVE LVOT Vmax:   138.33 cm/s LVOT Vmean:  88.533 cm/s LVOT VTI:    0.235 m  AORTA Ao Root diam: 3.20 cm MITRAL VALVE MV Area (PHT): 4.21 cm    SHUNTS MV Decel Time: 180 msec    Systemic VTI:  0.24 m MV E velocity: 93.80 cm/s  Systemic Diam: 2.10 cm MV A velocity: 84.75 cm/s MV E/A ratio:  1.11 Marsa Dooms MD Electronically signed by Marsa Dooms MD Signature Date/Time: 08/15/2023/8:03:58 AM    Final    CT Angio Chest Aorta w/CM &/OR wo/CM Result Date: 08/14/2023 CLINICAL DATA:  Acute aortic syndrome suspected. EXAM: CT ANGIOGRAPHY CHEST WITH CONTRAST TECHNIQUE: Multidetector CT imaging of the chest was performed using the standard protocol during bolus administration of intravenous contrast. Multiplanar CT image reconstructions and MIPs were obtained to evaluate the vascular anatomy. RADIATION DOSE REDUCTION: This exam was performed according to the departmental dose-optimization program which includes automated exposure control, adjustment of the mA and/or kV according to patient size and/or use of iterative reconstruction technique. CONTRAST:  80mL OMNIPAQUE  IOHEXOL  350 MG/ML SOLN COMPARISON:  Chest CT 09/12/2013. FINDINGS: Cardiovascular: Ascending aorta is dilated measuring up to 4 cm. Heart is normal in size. There is adequate opacification of the pulmonary arteries to the segmental level. There is no evidence for pulmonary embolism. Mediastinum/Nodes: No enlarged mediastinal, hilar, or axillary lymph nodes. Thyroid gland, trachea, and esophagus demonstrate no significant findings. Lungs/Pleura: Subpleural nodular densities and fissural nodular densities measuring 3 mm or less are unchanged from 2016 compatible with benign etiology. Lungs are otherwise clear. There is no pleural effusion or pneumothorax Upper Abdomen: No acute abnormality. Musculoskeletal: Bilateral breast implants are present. No acute  fractures are seen. Review of the MIP images confirms the above findings. IMPRESSION: 1. No evidence for pulmonary embolism. 2. No acute cardiopulmonary process. 3. 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 Recommend follow-up CT/MR every 12 months and vascular consultation. Electronically Signed   By: Greig Pique M.D.   On: 08/14/2023 18:39   DG Chest 2 View Result Date: 08/14/2023  CLINICAL DATA:  Chest pain and tightness radiating to the left shoulder and back. EXAM: CHEST - 2 VIEW COMPARISON:  CT 09/12/2013 FINDINGS: Heart size is normal. Mediastinal shadows are normal. The lungs are clear. No bronchial thickening. No infiltrate, mass, effusion or collapse. Pulmonary vascularity is normal. No bony abnormality. IMPRESSION: Normal chest. Electronically Signed   By: Oneil Officer M.D.   On: 08/14/2023 16:16    ECHO as above  TELEMETRY reviewed by me 08/16/2023: sinus rhythm rate 70s  EKG reviewed by me: sinus rhythm PVCs rate 89 bpm, nonacute  Data reviewed by me 08/16/2023: last 24h vitals tele labs imaging I/O hospitalist progress note  Principal Problem:   NSTEMI (non-ST elevated myocardial infarction) [facility]) Active Problems:   Chest pain   Essential hypertension   Hyperglycemia   Hypothyroidism   Depression    ASSESSMENT AND PLAN:  Chelsea Greene is a 53 y.o. female  with a past medical history of hypertension, hypothyroidism, anxiety who presented to the ED on 08/14/2023 for chest pain. Cardiology was consulted for further evaluation.   # Unstable angina # Elevated troponins Patient with episode of chest tightness Monday night while at gym. Resolved with rest then recurred yesterday and persisted. Troponins 23 > 26 > 21. EKG with NSR and PVCs, nonacute. Echo this admission demonstrated preserved EF, no WMAs. CTA coronary with significant mid RCA stenosis.  -Continue heparin  infusion, plan to DC after LHC.  -Continue metoprolol  succinate 25 mg daily.  -Continue  rosuvastatin  20 mg daily and aspirin  81 mg daily.  -Discussed the risks and benefits of proceeding with LHC for further evaluation with the patient.  She is agreeable to proceed.  NPO until LHC this morning (08/16/23) with Dr. Ammon.  Written consent will be obtained.  Further recommendations following LHC.     This patient's plan of care was discussed and created with Dr. Ammon and he is in agreement.  Signed: Danita Bloch, PA-C  08/16/2023, 8:00 AM [facility]

## 2023-08-16 NOTE — Progress Notes (Addendum)
 BP better, nausea and pain has resolved. Provider notified. Maintenance fluid restarted at previous rate- to infuse for 6 hours.

## 2023-08-16 NOTE — Consult Note (Signed)
 PHARMACY - ANTICOAGULATION CONSULT NOTE  Pharmacy Consult for IV Heparin  Indication: chest pain/ACS  Patient Measurements: Height: 5' 3 (160 cm) Weight: 68 kg (150 lb) IBW/kg (Calculated) : 52.4 Heparin  Dosing Weight: 66.3 kg  Labs: Recent Labs    08/14/23 1545 08/14/23 1751 08/15/23 0551 08/15/23 1412 08/15/23 2301  HGB 12.9  --  11.1*  --   --   HCT 39.1  --  33.1*  --   --   PLT 319  --  271  --   --   APTT  --  27  --   --   --   LABPROT  --  13.1  --   --   --   INR  --  1.0  --   --   --   HEPARINUNFRC  --   --  0.18* 0.49 0.46  CREATININE 1.02*  --  0.93  --   --   TROPONINIHS 23* 26* 21*  --   --    Estimated Creatinine Clearance: 64.7 mL/min (by C-G formula based on SCr of 0.93 mg/dL).  Medical History: Past Medical History:  Diagnosis Date   Anxiety    Hypertension    Medical history non-contributory    Varicose veins of both lower extremities    Medications:  Medication reconciliation is pending. No apparent anticoagulation prior to admission  Assessment: Patient is a 53 y/o F with medical history as above presenting to the ED with chest pain and tightness with radiation to left shoulder and back. There is concern for NSTEMI. Pharmacy consulted to initiate and manage heparin  infusion.   Baseline aPTT and PT-INR are pending. Baseline CBC within normal limits.  Goal of Therapy:  Heparin  level 0.3-0.7 units/ml Monitor platelets by anticoagulation protocol: Yes  1/8 0020  Received call from RN.  Just took over pt.  Found heparin  line disconnected with liquid on floor. Unsure how long line was disconnected and unsure if pt was even given initial bolus.  Instructed RN to reconnect line and continue heparin  infusion at 800 unit/hr.  Will check HL @ 0600. 1/8 0551 HL  0.18, subtherapeutic 1/8 1412 HL 0.49, therapeutic x1 1/8 2301 HL 0.46, therapeutic x 2   Plan:  Continue heparin  infusion at 1000 units/hr Recheck confirmatory HL daily w/ AM labs while  therapeutic Monitor heparin  levels daily while on heparin  infusion Monitor CBC and signs/symptoms of bleeding  Thank you for involving pharmacy in this patient's care.   Rankin CANDIE Dills, PharmD, [facility] 08/16/2023 12:05 AM

## 2023-08-17 DIAGNOSIS — R001 Bradycardia, unspecified: Secondary | ICD-10-CM

## 2023-08-17 DIAGNOSIS — E039 Hypothyroidism, unspecified: Secondary | ICD-10-CM | POA: Diagnosis not present

## 2023-08-17 DIAGNOSIS — R079 Chest pain, unspecified: Secondary | ICD-10-CM | POA: Diagnosis not present

## 2023-08-17 DIAGNOSIS — I200 Unstable angina: Secondary | ICD-10-CM | POA: Diagnosis not present

## 2023-08-17 DIAGNOSIS — I10 Essential (primary) hypertension: Secondary | ICD-10-CM | POA: Diagnosis not present

## 2023-08-17 LAB — BASIC METABOLIC PANEL
Anion gap: 6 (ref 5–15)
BUN: 12 mg/dL (ref 6–20)
CO2: 26 mmol/L (ref 22–32)
Calcium: 8.4 mg/dL — ABNORMAL LOW (ref 8.9–10.3)
Chloride: 108 mmol/L (ref 98–111)
Creatinine, Ser: 0.86 mg/dL (ref 0.44–1.00)
GFR, Estimated: >60 mL/min (ref >60)
Glucose, Bld: 91 mg/dL (ref 70–99)
Potassium: 4.2 mmol/L (ref 3.5–5.1)
Sodium: 140 mmol/L (ref 135–145)

## 2023-08-17 LAB — CBC
HCT: 32.1 % — ABNORMAL LOW (ref 36.0–46.0)
Hemoglobin: 10.6 g/dL — ABNORMAL LOW (ref 12.0–15.0)
MCH: 30.2 pg (ref 26.0–34.0)
MCHC: 33 g/dL (ref 30.0–36.0)
MCV: 91.5 fL (ref 80.0–100.0)
Platelets: 252 10*3/uL (ref 150–400)
RBC: 3.51 MIL/uL — ABNORMAL LOW (ref 3.87–5.11)
RDW: 12.8 % (ref 11.5–15.5)
WBC: 7.1 10*3/uL (ref 4.0–10.5)
nRBC: 0 % (ref 0.0–0.2)

## 2023-08-17 MED ORDER — METOPROLOL SUCCINATE ER 25 MG PO TB24: 12.5000 mg | ORAL_TABLET | Freq: Every day | ORAL | 2 refills | Status: AC

## 2023-08-17 MED ORDER — ASPIRIN 81 MG PO CHEW: 81.0000 mg | CHEWABLE_TABLET | Freq: Every day | ORAL | 3 refills | Status: AC

## 2023-08-17 MED ORDER — PRASUGREL HCL 10 MG PO TABS: 10.0000 mg | ORAL_TABLET | Freq: Every day | ORAL | 3 refills | Status: AC

## 2023-08-17 MED ORDER — ROSUVASTATIN CALCIUM 40 MG PO TABS: 40.0000 mg | ORAL_TABLET | Freq: Every day | ORAL | 2 refills | Status: AC

## 2023-08-17 MED ORDER — FE FUM-VIT C-VIT B12-FA 460-60-0.01-1 MG PO CAPS
1.0000 | ORAL_CAPSULE | Freq: Every day | ORAL | Status: DC
  Administered 2023-08-17: 1 via ORAL
  Filled 2023-08-17: qty 1

## 2023-08-17 MED ORDER — FE FUM-VIT C-VIT B12-FA 460-60-0.01-1 MG PO CAPS: 1.0000 | ORAL_CAPSULE | Freq: Every day | ORAL | 0 refills | Status: AC

## 2023-08-17 NOTE — Progress Notes (Signed)
 [facility] CARDIOLOGY PROGRESS NOTE       Patient ID: Chelsea Greene MRN: [id number] DOB/AGE: Jul 21, 1971 52 y.o.  Admit date: 08/14/2023 Referring Physician Dr. Greig Free Primary Physician Alla Amis, MD  Primary Cardiologist None Reason for Consultation chest pain, mildly elevated troponin  HPI: Chelsea Greene is a 53 y.o. female  with a past medical history of hypertension, hypothyroidism, anxiety who presented to the ED on 08/14/2023 for chest pain. Cardiology was consulted for further evaluation.   Interval history: -Patient feeling well overall this AM, denies any episodes of chest pain. -Has been out of bed walking around her room which she has tolerated well. -BP and HR remain stable, no evidence of arrhythmia on tele.  -Renal function stable this AM. Hgb down slightly, anticipate this to improve now that she is off heparin .  Review of systems complete and found to be negative unless listed above    Past Medical History:  Diagnosis Date   Anxiety    Hypertension    Medical history non-contributory    Varicose veins of both lower extremities     Past Surgical History:  Procedure Laterality Date   CORONARY STENT INTERVENTION N/A 08/16/2023   Procedure: CORONARY STENT INTERVENTION;  Surgeon: Ammon Blunt, MD;  Location: [facility] INVASIVE CV LAB;  Service: Cardiovascular;  Laterality: N/A;   LEFT HEART CATH AND CORONARY ANGIOGRAPHY N/A 08/16/2023   Procedure: LEFT HEART CATH AND CORONARY ANGIOGRAPHY;  Surgeon: Ammon Blunt, MD;  Location: [facility] INVASIVE CV LAB;  Service: Cardiovascular;  Laterality: N/A;   TONSILLECTOMY      Medications Prior to Admission  Medication Sig Dispense Refill Last Dose/Taking   hydrochlorothiazide  (HYDRODIURIL ) 12.5 MG tablet Take 12.5 mg by mouth daily.   08/14/2023 Morning   levothyroxine  (SYNTHROID ) 88 MCG tablet Take 88 mcg by mouth daily before breakfast.   08/14/2023 Morning   MIMVEY 1-0.5 MG tablet TAKE 1 TABLET(S) EVERY  DAY BY ORAL ROUTE.  12 08/14/2023   ARMOUR THYROID 60 MG tablet       baclofen  (LIORESAL ) 10 MG tablet Take 1 tablet (10 mg total) by mouth 2 (two) times daily. 30 each 0    colchicine (COLCRYS) 0.6 MG tablet Colcrys 0.6 mg tablet      escitalopram  (LEXAPRO ) 10 MG tablet Take 10 mg by mouth daily.      ESTRING  2 MG vaginal ring       hydrocortisone (PROCTOSOL HC) 2.5 % rectal cream Proctosol HC 2.5 % topical cream perineal applicator  APPLY SPARINGLY TO AFFECTED AREA 2 TO 4 TIMES A DAY      meloxicam (MOBIC) 15 MG tablet meloxicam 15 mg tablet  TAKE 1 TABLET BY MOUTH ONCE A DAY AS NEEDED FOR PAIN TAKE WITH FOOD      vitamin B-12 (CYANOCOBALAMIN) 1000 MCG tablet Take by mouth. (Patient not taking: Reported on 08/14/2023)   Not Taking   Social History   Socioeconomic History   Marital status: Married    Spouse name: Not on file   Number of children: Not on file   Years of education: Not on file   Highest education level: Not on file  Occupational History   Not on file  Tobacco Use   Smoking status: Never   Smokeless tobacco: Never  Vaping Use   Vaping status: Never Used  Substance and Sexual Activity   Alcohol use: Yes    Comment: ocassionally   Drug use: No   Sexual activity: Not Currently  Other Topics  Concern   Not on file  Social History Narrative   Not on file   Social Drivers of Health   Financial Resource Strain: Low Risk  (06/21/2023)   Received from [facility]   Overall Financial Resource Strain (CARDIA)    Difficulty of Paying Living Expenses: Not hard at all  Food Insecurity: No Food Insecurity (08/15/2023)   Hunger Vital Sign    Worried About Running Out of Food in the Last Year: Never true    Ran Out of Food in the Last Year: Never true  Transportation Needs: No Transportation Needs (08/15/2023)   PRAPARE - Administrator, Civil Service (Medical): No    Lack of Transportation (Non-Medical): No  Physical Activity: Not on file   Stress: Not on file  Social Connections: Not on file  Intimate Partner Violence: Not At Risk (08/15/2023)   Humiliation, Afraid, Rape, and Kick questionnaire    Fear of Current or Ex-Partner: No    Emotionally Abused: No    Physically Abused: No    Sexually Abused: No    Family History  Problem Relation Age of Onset   Heart attack Mother    Asthma Mother    Cancer Father    Aneurysm Sister      Vitals:   08/16/23 1927 08/16/23 2327 08/17/23 0442 08/17/23 0737  BP: 119/70 104/62 108/72 110/70  Pulse: 63 64 64 65  Resp: 18 16 20 18   Temp: 97.9 F (36.6 C) 98.6 F (37 C) 98.4 F (36.9 C) 98.3 F (36.8 C)  TempSrc: Oral  Oral   SpO2: 100% 100% 99% 99%  Weight:      Height:        PHYSICAL EXAM General: Well appearing female, well nourished, in no acute distress resting comfortably in hospital bed with husband present at bedside. HEENT: Normocephalic and atraumatic. Neck: No JVD.  Lungs: Normal respiratory effort on room air. Clear bilaterally to auscultation. No wheezes, crackles, rhonchi.  Heart: HRRR. Normal S1 and S2 without gallops or murmurs.  Abdomen: Non-distended appearing.  Msk: Normal strength and tone for age. Extremities: Warm and well perfused. No clubbing, cyanosis. No edema.  Neuro: Alert and oriented X 3. Psych: Answers questions appropriately.   Labs: Basic Metabolic Panel: Recent Labs    08/15/23 0551 08/16/23 0443 08/17/23 0314  NA 140 136 140  K 3.5 4.0 4.2  CL 102 101 108  CO2 27 26 26   GLUCOSE 92 92 91  BUN 17 15 12   CREATININE 0.93 0.91 0.86  CALCIUM  8.9 8.2* 8.4*  MG 1.8  --   --    Liver Function Tests: No results for input(s): AST, ALT, ALKPHOS, BILITOT, PROT, ALBUMIN in the last 72 hours. No results for input(s): LIPASE, AMYLASE in the last 72 hours. CBC: Recent Labs    08/16/23 0444 08/17/23 0314  WBC 6.1 7.1  HGB 11.4* 10.6*  HCT 34.2* 32.1*  MCV 88.8 91.5  PLT 278 252   Cardiac Enzymes: Recent Labs     08/14/23 1545 08/14/23 1751 08/15/23 0551  TROPONINIHS 23* 26* 21*   BNP: No results for input(s): BNP in the last 72 hours. D-Dimer: No results for input(s): DDIMER in the last 72 hours. Hemoglobin A1C: Recent Labs    08/15/23 0551  HGBA1C 5.2   Fasting Lipid Panel: Recent Labs    08/15/23 0551  CHOL 203*  HDL 71  LDLCALC 121*  TRIG 54  CHOLHDL 2.9   Thyroid Function  Tests: No results for input(s): TSH, T4TOTAL, T3FREE, THYROIDAB in the last 72 hours.  Invalid input(s): FREET3 Anemia Panel: No results for input(s): VITAMINB12, FOLATE, FERRITIN, TIBC, IRON, RETICCTPCT in the last 72 hours.   Radiology: CARDIAC CATHETERIZATION Result Date: 08/16/2023   Mid RCA lesion is 99% stenosed.   Prox RCA lesion is 20% stenosed.   Prox LAD to Mid LAD lesion is 50% stenosed.   Mid LAD lesion is 60% stenosed.   Dist LAD lesion is 60% stenosed.   A drug-eluting stent was successfully placed using a STENT ONYX FRONTIER 3.0X15.   Post intervention, there is a 0% residual stenosis.   The left ventricular systolic function is normal.   LV end diastolic pressure is normal.   The left ventricular ejection fraction is 55-65% by visual estimate. 1.  Unstable angina 2.  One-vessel coronary artery disease with 99% stenosis mid RCA, sequential 50/60/60% stenosis proximal/mid/distal LAD 3.  Normal left ventricular function 4.  Successful PCI with 3.0 x 15 mm Onyx frontier DES mid RCA Recommendations 1.  Dual antiplatelet therapy uninterrupted x 1 year 2.  Aggressive risk factor modification   CT CORONARY FFR DATA PREP & FLUID ANALYSIS Result Date: 08/15/2023 EXAM: CT FFR ANALYSIS CLINICAL DATA:  Abnormal CCTA FINDINGS: FFRct analysis was performed on the original cardiac CT angiogram dataset. Diagrammatic representation of the FFRct analysis is provided in a separate PDF document in PACS. This dictation was created using the PDF document and an interactive 3D model of the  results. 3D model is not available in the EMR/PACS. Normal FFR range is >0.80. 1. Left Main:  No significant stenosis. 2. LAD: No significant stenosis.  FFRct in mid segment 0.81 3. LCX: No significant stenosis.  FFRct 0.96 4. RCA: significant stenosis in mid RCA.  FFRct 0.59 IMPRESSION: 1. CT FFR analysis showed significant stenosis in the mid RCA. FFRct 0.59. 2.  Cardiac catheterization recommended. Electronically Signed   By: Redell Cave M.D.   On: 08/15/2023 14:20   CT CORONARY MORPH W/CTA COR W/SCORE W/CA W/CM &/OR WO/CM Addendum Date: 08/15/2023 ADDENDUM REPORT: 08/15/2023 13:25 EXAM: OVER-READ INTERPRETATION  CT CHEST The following report is an over-read performed by radiologist Dr. Vanetta Mortimer [facility] on 08/15/2023. This over-read does not include interpretation of cardiac or coronary anatomy or pathology. The coronary CTA interpretation by the cardiologist is attached. COMPARISON:  Chest CT dated 08/14/2023. FINDINGS: The visualized thoracic aorta and central pulmonary arteries appear unremarkable. No hilar or mediastinal adenopathy noted. The esophagus is grossly unremarkable as visualized. The visualized lungs are clear. No acute findings in the visualized upper abdomen. Bilateral breast implants.  The osseous structures are intact. IMPRESSION: No acute or significant extracardiac findings. Electronically Signed   By: Vanetta Chou M.D.   On: 08/15/2023 13:25   Result Date: 08/15/2023 CLINICAL DATA:  Chest pain EXAM: Cardiac/Coronary  CTA TECHNIQUE: The patient was scanned on a Siemens Somatom scanner. : A retrospective scan was triggered in the ascending thoracic aorta. Axial non-contrast 3 mm slices were carried out through the heart. The data set was analyzed on a dedicated work station and scored using the Agatson method. Gantry rotation speed was 66 msecs and collimation was .6 mm. 5 mg of ivabradine  and 0.8 mg of sl NTG was given. The 3D data set was reconstructed  in 5% intervals of the 60-95 % of the R-R cycle. Diastolic phases were analyzed on a dedicated work station using MPR, MIP and VRT modes. The patient  received 80 cc of contrast. FINDINGS: Aorta:  Normal size.  No calcifications.  No dissection. Aortic Valve:  Trileaflet.  No calcifications. Coronary Arteries:  Normal coronary origin.  Right dominance. RCA is a dominant artery. There is non calcified plaque in the mid segment causing severe stenosis (>70%). Left main gives rise to LAD and LCX arteries. LM has no disease. LAD has calcified and non calcified plaque in the proximal-mid segment causing moderate stenosis (50-69%) . LCX is a non-dominant artery.  There is no plaque. Other findings: Normal pulmonary vein drainage into the left atrium. Normal left atrial appendage without a thrombus. Normal size of the pulmonary artery. IMPRESSION: 1. Coronary calcium  score of 258. This was 99th percentile for age and sex matched control. 2. Normal coronary origin with right dominance. 3. 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 4. Moderate proximal-mid LAD stenosis (50-69%). 5. CAD-RADS 4 Severe stenosis. (70-99% or > 50% left main). Cardiac catheterization is recommended. Consider symptom-guided anti-ischemic pharmacotherapy as well as risk factor modification per guideline directed care. 6. Additional analysis with CT FFR will be submitted and reported separately. Electronically Signed: By: Redell Cave M.D. On: 08/15/2023 12:21   ECHOCARDIOGRAM COMPLETE Result Date: 08/15/2023    ECHOCARDIOGRAM REPORT   Patient Name:   QUISHA MABIE Date of Exam: 08/14/2023 Medical Rec #:  [id number]       Height:       63.0 in Accession #:    [id number]      Weight:       150.0 lb Date of Birth:  March 02, 1971       BSA:          1.711 m Patient Age:    52 years        BP:           144/80 mmHg Patient Gender: F               HR:           109 bpm. Exam Location:  [facility] Procedure: 2D Echo, Cardiac Doppler and Color Doppler Indications:      R07.9 Chest pain                  I21.4 NSTEMI  History:         Patient has no prior history of Echocardiogram examinations.                  Risk Factors:Hypertension.  Sonographer:     Carl Coma RDCS Referring Phys:  [id number] AMY N COX Diagnosing Phys: Marsa Dooms MD IMPRESSIONS  1. Left ventricular ejection fraction, by estimation, is 60 to 65%. The left ventricle has normal function. The left ventricle has no regional wall motion abnormalities. Left ventricular diastolic parameters were normal.  2. Right ventricular systolic function is normal. The right ventricular size is normal.  3. The mitral valve is normal in structure. Trivial mitral valve regurgitation. No evidence of mitral stenosis.  4. The aortic valve is normal in structure. Aortic valve regurgitation is not visualized. No aortic stenosis is present.  5. The inferior vena cava is normal in size with greater than 50% respiratory variability, suggesting right atrial pressure of 3 mmHg. FINDINGS  Left Ventricle: Left ventricular ejection fraction, by estimation, is 60 to 65%. The left ventricle has normal function. The left ventricle has no regional wall motion abnormalities. The left ventricular internal cavity size was normal in size. There is  no left ventricular hypertrophy. Left ventricular diastolic  parameters were normal. Right Ventricle: The right ventricular size is normal. No increase in right ventricular wall thickness. Right ventricular systolic function is normal. Left Atrium: Left atrial size was normal in size. Right Atrium: Right atrial size was normal in size. Pericardium: There is no evidence of pericardial effusion. Mitral Valve: The mitral valve is normal in structure. Trivial mitral valve regurgitation. No evidence of mitral valve stenosis. Tricuspid Valve: The tricuspid valve is normal in structure. Tricuspid valve regurgitation is trivial. No evidence of tricuspid stenosis. Aortic Valve: The aortic valve is  normal in structure. Aortic valve regurgitation is not visualized. No aortic stenosis is present. Pulmonic Valve: The pulmonic valve was normal in structure. Pulmonic valve regurgitation is not visualized. No evidence of pulmonic stenosis. Aorta: The aortic root is normal in size and structure. Venous: The inferior vena cava is normal in size with greater than 50% respiratory variability, suggesting right atrial pressure of 3 mmHg. IAS/Shunts: No atrial level shunt detected by color flow Doppler.  LEFT VENTRICLE PLAX 2D LVIDd:         3.90 cm   Diastology LVIDs:         2.80 cm   LV e' medial:    11.20 cm/s LV PW:         0.90 cm   LV E/e' medial:  8.4 LV IVS:        0.80 cm   LV e' lateral:   14.17 cm/s LVOT diam:     2.10 cm   LV E/e' lateral: 6.6 LV SV:         81 LV SV Index:   48 LVOT Area:     3.46 cm  RIGHT VENTRICLE             IVC RV Basal diam:  3.00 cm     IVC diam: 1.70 cm RV S prime:     18.75 cm/s TAPSE (M-mode): 2.7 cm LEFT ATRIUM             Index        RIGHT ATRIUM          Index LA diam:        3.60 cm 2.10 cm/m   RA Area:     8.23 cm LA Vol (A2C):   33.6 ml 19.64 ml/m  RA Volume:   16.60 ml 9.70 ml/m LA Vol (A4C):   21.0 ml 12.27 ml/m LA Biplane Vol: 29.1 ml 17.01 ml/m  AORTIC VALVE LVOT Vmax:   138.33 cm/s LVOT Vmean:  88.533 cm/s LVOT VTI:    0.235 m  AORTA Ao Root diam: 3.20 cm MITRAL VALVE MV Area (PHT): 4.21 cm    SHUNTS MV Decel Time: 180 msec    Systemic VTI:  0.24 m MV E velocity: 93.80 cm/s  Systemic Diam: 2.10 cm MV A velocity: 84.75 cm/s MV E/A ratio:  1.11 Marsa Dooms MD Electronically signed by Marsa Dooms MD Signature Date/Time: 08/15/2023/8:03:58 AM    Final    CT Angio Chest Aorta w/CM &/OR wo/CM Result Date: 08/14/2023 CLINICAL DATA:  Acute aortic syndrome suspected. EXAM: CT ANGIOGRAPHY CHEST WITH CONTRAST TECHNIQUE: Multidetector CT imaging of the chest was performed using the standard protocol during bolus administration of intravenous contrast.  Multiplanar CT image reconstructions and MIPs were obtained to evaluate the vascular anatomy. RADIATION DOSE REDUCTION: This exam was performed according to the departmental dose-optimization program which includes automated exposure control, adjustment of the mA and/or kV according to patient size and/or  use of iterative reconstruction technique. CONTRAST:  80mL OMNIPAQUE  IOHEXOL  350 MG/ML SOLN COMPARISON:  Chest CT 09/12/2013. FINDINGS: Cardiovascular: Ascending aorta is dilated measuring up to 4 cm. Heart is normal in size. There is adequate opacification of the pulmonary arteries to the segmental level. There is no evidence for pulmonary embolism. Mediastinum/Nodes: No enlarged mediastinal, hilar, or axillary lymph nodes. Thyroid gland, trachea, and esophagus demonstrate no significant findings. Lungs/Pleura: Subpleural nodular densities and fissural nodular densities measuring 3 mm or less are unchanged from 2016 compatible with benign etiology. Lungs are otherwise clear. There is no pleural effusion or pneumothorax Upper Abdomen: No acute abnormality. Musculoskeletal: Bilateral breast implants are present. No acute fractures are seen. Review of the MIP images confirms the above findings. IMPRESSION: 1. No evidence for pulmonary embolism. 2. No acute cardiopulmonary process. 3. 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 Recommend follow-up CT/MR every 12 months and vascular consultation. Electronically Signed   By: Greig Pique M.D.   On: 08/14/2023 18:39   DG Chest 2 View Result Date: 08/14/2023 CLINICAL DATA:  Chest pain and tightness radiating to the left shoulder and back. EXAM: CHEST - 2 VIEW COMPARISON:  CT 09/12/2013 FINDINGS: Heart size is normal. Mediastinal shadows are normal. The lungs are clear. No bronchial thickening. No infiltrate, mass, effusion or collapse. Pulmonary vascularity is normal. No bony abnormality. IMPRESSION: Normal chest. Electronically Signed   By: Oneil Officer M.D.   On: 08/14/2023 16:16    ECHO as above  TELEMETRY reviewed by me 08/17/2023: sinus rhythm rate 70s  EKG reviewed by me: sinus rhythm PVCs rate 89 bpm, nonacute  Data reviewed by me 08/17/2023: last 24h vitals tele labs imaging I/O hospitalist progress note  Principal Problem:   Unstable angina (HCC) Active Problems:   Chest pain   Essential hypertension   Hyperglycemia   Hypothyroidism   Depression    ASSESSMENT AND PLAN:  Chelsea Greene is a 53 y.o. female  with a past medical history of hypertension, hypothyroidism, anxiety who presented to the ED on 08/14/2023 for chest pain. Cardiology was consulted for further evaluation.   # Unstable angina # Elevated troponins Patient with episode of chest tightness Monday night while at gym. Resolved with rest then recurred yesterday and persisted. Troponins 23 > 26 > 21. EKG with NSR and PVCs, nonacute. Echo this admission demonstrated preserved EF, no WMAs. CTA coronary with significant mid RCA stenosis.  LHC yesterday with 99% mid RCA stenosis s/p 3.0 x 15 mm Onyx frontier DES.  Has residual LAD stenosis between 50 and 60%. -Continue metoprolol  succinate 12.5 mg daily.  Discontinue home hydrochlorothiazide . -Plan for DAPT x 12 months with aspirin  81 mg daily and Effient  10 mg daily.  Continue rosuvastatin  40 mg daily. -Referral for cardiac rehab on discharge. -Encouraged healthy lifestyle and diet with patient. -Discussed post cath care in detail.  Ok for discharge today from a cardiac perspective. Will arrange for follow up in clinic with Dr. Ammon in 1-2 weeks.   This patient's plan of care was discussed and created with Dr. Ammon and he is in agreement.  Signed: Danita Bloch, PA-C  08/17/2023, 10:23 AM [facility]

## 2023-08-17 NOTE — Discharge Summary (Signed)
 Physician Discharge Summary   Patient: Chelsea Greene MRN: [id number] DOB: March 31, 1971  Admit date:     08/14/2023  Discharge date: 08/17/23  Discharge Physician: Amaryllis Dare   PCP: Alla Amis, MD   Recommendations at discharge:  Please obtain CBC and BMP and follow-up Follow-up with cardiology within a week Follow-up with primary care provider  Discharge Diagnoses: Principal Problem:   Unstable angina [facility]) Active Problems:   Chest pain   Essential hypertension   Hyperglycemia   Hypothyroidism   Depression   Hospital Course: [name] is a 53 year old female with history of hypertension, anxiety, hypothyroid, hyperlipidemia, who presents to the emergency department for chief concerns of chest pain, chest tightness.  Vitals in the ED showed T 98.2, rr 18, heart rate 78, blood pressure 150/77, SpO2 100% on room air.  Serum sodium is 138, potassium 4.5, chloride 99, bicarb 29, BUN of 20, sCr 1.02, eGFR greater than 60, nonfasting blood glucose 113, WBC 9.2, hemoglobin 12.9, platelets of 319.  HS troponin is 23 and on repeat is 29.  CTA Chest Aorta w/cm or wo/cm: no evidence of pulmonary embolism. No acute cardiopulmonary process. 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   1/8: Vital stable, echocardiogram without any significant abnormality.  Patient did not met criteria for NSTEMI so it ruled out.  Likely unstable angina.  CTA coronary was done by cardiology and there was a concerning RCA stenosis so they are planning to do cardiac cath tomorrow morning.  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  Patient will need at least 24-month of DAPT.  Also found to have stenosis on the left side which will be currently treated medically.  Cardiology also added Crestor , stopped HCTZ and added metoprolol .  1/10: Remained hemodynamically stable.  Hemoglobin at 10.6  today.  Patient was started on supplement.  She was able to walk without any shortness of breath or chest pain.  Cardiology cleared her for discharge on metoprolol , aspirin  and prasugrel  as DAPT.  Lipoprotein a levels were obtained and pending results which will be discussed by her cardiologist as outpatient.  Patient was also started on high intensity statin.  She will continue on current medications and need to have a close follow-up with her providers for further management.  Assessment and Plan: * Unstable angina [facility]) Patient had barely positive troponin with a flat curve, likely unstable angina so NSTEMI ruled out. Echocardiogram without any significant abnormality but CTA  coronary with concern of RCA stenosis. A1c 5.2, lipid panel with mildly elevated total cholesterol at 203, LDL at 121 with goal should be less than 70. Cardiac catheterization with 99% stenosis of RCA s/p PCI, also found to have up to 50% stenosis on other vessels which will be currently treated medically -Patient was started on DAPT with aspirin  and prasugrel  -Started on high intensity statin -Started on metoprolol  -Continue to monitor  Chest pain Due to unstable angina. -Please see management above  Depression Home escitalopram  5 mg daily resumed  Hypothyroidism Levothyroxine  88 mcg daily before breakfast resumed  Hyperglycemia CBG within goal.  A1c of 5.2  Essential hypertension Home HCTZ is being switched with metoprolol    Consultants: Cardiology Procedures performed: Cardiac catheterization Disposition: Home Diet recommendation:  Discharge Diet Orders (From admission, onward)     Start     Ordered   08/17/23 0000  Diet - low sodium heart healthy  08/17/23 1024           Cardiac diet DISCHARGE MEDICATION: Allergies as of 08/17/2023   No Known Allergies      Medication List     STOP taking these medications    Armour Thyroid 60 MG tablet Generic drug: thyroid   baclofen   10 MG tablet Commonly known as: LIORESAL    Colcrys 0.6 MG tablet Generic drug: colchicine   cyanocobalamin 1000 MCG tablet Commonly known as: VITAMIN B12   escitalopram  10 MG tablet Commonly known as: LEXAPRO    Estring  2 MG vaginal ring Generic drug: estradiol    hydrochlorothiazide  12.5 MG tablet Commonly known as: HYDRODIURIL    meloxicam 15 MG tablet Commonly known as: MOBIC       TAKE these medications    aspirin  81 MG chewable tablet Chew 1 tablet (81 mg total) by mouth daily.   Fe Fum-Vit C-Vit B12-FA Caps capsule Commonly known as: TRIGELS-F FORTE Take 1 capsule by mouth daily after breakfast.   levothyroxine  88 MCG tablet Commonly known as: SYNTHROID  Take 88 mcg by mouth daily before breakfast.   metoprolol  succinate 25 MG 24 hr tablet Commonly known as: TOPROL -XL Take 0.5 tablets (12.5 mg total) by mouth daily.   Mimvey 1-0.5 MG tablet Generic drug: estradiol -norethindrone  TAKE 1 TABLET(S) EVERY DAY BY ORAL ROUTE.   prasugrel  10 MG Tabs tablet Commonly known as: EFFIENT  Take 1 tablet (10 mg total) by mouth daily.   Proctosol HC 2.5 % rectal cream Generic drug: hydrocortisone Proctosol HC 2.5 % topical cream perineal applicator  APPLY SPARINGLY TO AFFECTED AREA 2 TO 4 TIMES A DAY   rosuvastatin  40 MG tablet Commonly known as: CRESTOR  Take 1 tablet (40 mg total) by mouth daily.        Follow-up Information     Paraschos, Alexander, MD. Go in 1 week(s).   Specialty: Cardiology Contact information: [address] [facility] [place] [address] [phone]         Alla Amis, MD. Schedule an appointment as soon as possible for a visit in 1 week(s).   Specialty: Family Medicine Contact information: [address] [facility] [place] [address] [phone]                Discharge Exam: Filed Weights   08/14/23 1537 08/16/23 0901 08/16/23 0942  Weight: 68 kg 67.7 kg  67.7 kg   General.  Well-developed lady, in no acute distress. Pulmonary.  Lungs clear bilaterally, normal respiratory effort. CV.  Regular rate and rhythm, no JVD, rub or murmur. Abdomen.  Soft, nontender, nondistended, BS positive. CNS.  Alert and oriented .  No focal neurologic deficit. Extremities.  No edema, no cyanosis, pulses intact and symmetrical. Psychiatry.  Judgment and insight appears normal.   Condition at discharge: stable  The results of significant diagnostics from this hospitalization (including imaging, microbiology, ancillary and laboratory) are listed below for reference.   Imaging Studies: CARDIAC CATHETERIZATION Result Date: 08/16/2023   Mid RCA lesion is 99% stenosed.   Prox RCA lesion is 20% stenosed.   Prox LAD to Mid LAD lesion is 50% stenosed.   Mid LAD lesion is 60% stenosed.   Dist LAD lesion is 60% stenosed.   A drug-eluting stent was successfully placed using a STENT ONYX FRONTIER 3.0X15.   Post intervention, there is a 0% residual stenosis.   The left ventricular systolic function is normal.   LV end diastolic pressure is normal.   The left ventricular ejection  fraction is 55-65% by visual estimate. 1.  Unstable angina 2.  One-vessel coronary artery disease with 99% stenosis mid RCA, sequential 50/60/60% stenosis proximal/mid/distal LAD 3.  Normal left ventricular function 4.  Successful PCI with 3.0 x 15 mm Onyx frontier DES mid RCA Recommendations 1.  Dual antiplatelet therapy uninterrupted x 1 year 2.  Aggressive risk factor modification   CT CORONARY FFR DATA PREP & FLUID ANALYSIS Result Date: 08/15/2023 EXAM: CT FFR ANALYSIS CLINICAL DATA:  Abnormal CCTA FINDINGS: FFRct analysis was performed on the original cardiac CT angiogram dataset. Diagrammatic representation of the FFRct analysis is provided in a separate PDF document in PACS. This dictation was created using the PDF document and an interactive 3D model of the results. 3D model is not available in the  EMR/PACS. Normal FFR range is >0.80. 1. Left Main:  No significant stenosis. 2. LAD: No significant stenosis.  FFRct in mid segment 0.81 3. LCX: No significant stenosis.  FFRct 0.96 4. RCA: significant stenosis in mid RCA.  FFRct 0.59 IMPRESSION: 1. CT FFR analysis showed significant stenosis in the mid RCA. FFRct 0.59. 2.  Cardiac catheterization recommended. Electronically Signed   By: Redell Cave M.D.   On: 08/15/2023 14:20   CT CORONARY MORPH W/CTA COR W/SCORE W/CA W/CM &/OR WO/CM Addendum Date: 08/15/2023 ADDENDUM REPORT: 08/15/2023 13:25 EXAM: OVER-READ INTERPRETATION  CT CHEST The following report is an over-read performed by radiologist Dr. Vanetta Mortimer [facility] on 08/15/2023. This over-read does not include interpretation of cardiac or coronary anatomy or pathology. The coronary CTA interpretation by the cardiologist is attached. COMPARISON:  Chest CT dated 08/14/2023. FINDINGS: The visualized thoracic aorta and central pulmonary arteries appear unremarkable. No hilar or mediastinal adenopathy noted. The esophagus is grossly unremarkable as visualized. The visualized lungs are clear. No acute findings in the visualized upper abdomen. Bilateral breast implants.  The osseous structures are intact. IMPRESSION: No acute or significant extracardiac findings. Electronically Signed   By: Vanetta Chou M.D.   On: 08/15/2023 13:25   Result Date: 08/15/2023 CLINICAL DATA:  Chest pain EXAM: Cardiac/Coronary  CTA TECHNIQUE: The patient was scanned on a Siemens Somatom scanner. : A retrospective scan was triggered in the ascending thoracic aorta. Axial non-contrast 3 mm slices were carried out through the heart. The data set was analyzed on a dedicated work station and scored using the Agatson method. Gantry rotation speed was 66 msecs and collimation was .6 mm. 5 mg of ivabradine  and 0.8 mg of sl NTG was given. The 3D data set was reconstructed in 5% intervals of the 60-95 % of the R-R  cycle. Diastolic phases were analyzed on a dedicated work station using MPR, MIP and VRT modes. The patient received 80 cc of contrast. FINDINGS: Aorta:  Normal size.  No calcifications.  No dissection. Aortic Valve:  Trileaflet.  No calcifications. Coronary Arteries:  Normal coronary origin.  Right dominance. RCA is a dominant artery. There is non calcified plaque in the mid segment causing severe stenosis (>70%). Left main gives rise to LAD and LCX arteries. LM has no disease. LAD has calcified and non calcified plaque in the proximal-mid segment causing moderate stenosis (50-69%) . LCX is a non-dominant artery.  There is no plaque. Other findings: Normal pulmonary vein drainage into the left atrium. Normal left atrial appendage without a thrombus. Normal size of the pulmonary artery. IMPRESSION: 1. Coronary calcium  score of 258. This was 99th percentile for age and sex matched control. 2. Normal coronary origin  with right dominance. 3. 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 4. Moderate proximal-mid LAD stenosis (50-69%). 5. CAD-RADS 4 Severe stenosis. (70-99% or > 50% left main). Cardiac catheterization is recommended. Consider symptom-guided anti-ischemic pharmacotherapy as well as risk factor modification per guideline directed care. 6. Additional analysis with CT FFR will be submitted and reported separately. Electronically Signed: By: Redell Cave M.D. On: 08/15/2023 12:21   ECHOCARDIOGRAM COMPLETE Result Date: 08/15/2023    ECHOCARDIOGRAM REPORT   Patient Name:   JAMEA ROBICHEAUX Date of Exam: 08/14/2023 Medical Rec #:  [id number]       Height:       63.0 in Accession #:    [id number]      Weight:       150.0 lb Date of Birth:  February 05, 1971       BSA:          1.711 m Patient Age:    52 years        BP:           144/80 mmHg Patient Gender: F               HR:           109 bpm. Exam Location:  [facility] Procedure: 2D Echo, Cardiac Doppler and Color Doppler Indications:     R07.9 Chest pain                  I21.4  NSTEMI  History:         Patient has no prior history of Echocardiogram examinations.                  Risk Factors:Hypertension.  Sonographer:     Carl Coma RDCS Referring Phys:  [id number] AMY N COX Diagnosing Phys: Marsa Dooms MD IMPRESSIONS  1. Left ventricular ejection fraction, by estimation, is 60 to 65%. The left ventricle has normal function. The left ventricle has no regional wall motion abnormalities. Left ventricular diastolic parameters were normal.  2. Right ventricular systolic function is normal. The right ventricular size is normal.  3. The mitral valve is normal in structure. Trivial mitral valve regurgitation. No evidence of mitral stenosis.  4. The aortic valve is normal in structure. Aortic valve regurgitation is not visualized. No aortic stenosis is present.  5. The inferior vena cava is normal in size with greater than 50% respiratory variability, suggesting right atrial pressure of 3 mmHg. FINDINGS  Left Ventricle: Left ventricular ejection fraction, by estimation, is 60 to 65%. The left ventricle has normal function. The left ventricle has no regional wall motion abnormalities. The left ventricular internal cavity size was normal in size. There is  no left ventricular hypertrophy. Left ventricular diastolic parameters were normal. Right Ventricle: The right ventricular size is normal. No increase in right ventricular wall thickness. Right ventricular systolic function is normal. Left Atrium: Left atrial size was normal in size. Right Atrium: Right atrial size was normal in size. Pericardium: There is no evidence of pericardial effusion. Mitral Valve: The mitral valve is normal in structure. Trivial mitral valve regurgitation. No evidence of mitral valve stenosis. Tricuspid Valve: The tricuspid valve is normal in structure. Tricuspid valve regurgitation is trivial. No evidence of tricuspid stenosis. Aortic Valve: The aortic valve is normal in structure. Aortic valve  regurgitation is not visualized. No aortic stenosis is present. Pulmonic Valve: The pulmonic valve was normal in structure. Pulmonic valve regurgitation is not visualized. No evidence of pulmonic stenosis. Aorta: The aortic root  is normal in size and structure. Venous: The inferior vena cava is normal in size with greater than 50% respiratory variability, suggesting right atrial pressure of 3 mmHg. IAS/Shunts: No atrial level shunt detected by color flow Doppler.  LEFT VENTRICLE PLAX 2D LVIDd:         3.90 cm   Diastology LVIDs:         2.80 cm   LV e' medial:    11.20 cm/s LV PW:         0.90 cm   LV E/e' medial:  8.4 LV IVS:        0.80 cm   LV e' lateral:   14.17 cm/s LVOT diam:     2.10 cm   LV E/e' lateral: 6.6 LV SV:         81 LV SV Index:   48 LVOT Area:     3.46 cm  RIGHT VENTRICLE             IVC RV Basal diam:  3.00 cm     IVC diam: 1.70 cm RV S prime:     18.75 cm/s TAPSE (M-mode): 2.7 cm LEFT ATRIUM             Index        RIGHT ATRIUM          Index LA diam:        3.60 cm 2.10 cm/m   RA Area:     8.23 cm LA Vol (A2C):   33.6 ml 19.64 ml/m  RA Volume:   16.60 ml 9.70 ml/m LA Vol (A4C):   21.0 ml 12.27 ml/m LA Biplane Vol: 29.1 ml 17.01 ml/m  AORTIC VALVE LVOT Vmax:   138.33 cm/s LVOT Vmean:  88.533 cm/s LVOT VTI:    0.235 m  AORTA Ao Root diam: 3.20 cm MITRAL VALVE MV Area (PHT): 4.21 cm    SHUNTS MV Decel Time: 180 msec    Systemic VTI:  0.24 m MV E velocity: 93.80 cm/s  Systemic Diam: 2.10 cm MV A velocity: 84.75 cm/s MV E/A ratio:  1.11 Marsa Dooms MD Electronically signed by Marsa Dooms MD Signature Date/Time: 08/15/2023/8:03:58 AM    Final    CT Angio Chest Aorta w/CM &/OR wo/CM Result Date: 08/14/2023 CLINICAL DATA:  Acute aortic syndrome suspected. EXAM: CT ANGIOGRAPHY CHEST WITH CONTRAST TECHNIQUE: Multidetector CT imaging of the chest was performed using the standard protocol during bolus administration of intravenous contrast. Multiplanar CT image reconstructions and  MIPs were obtained to evaluate the vascular anatomy. RADIATION DOSE REDUCTION: This exam was performed according to the departmental dose-optimization program which includes automated exposure control, adjustment of the mA and/or kV according to patient size and/or use of iterative reconstruction technique. CONTRAST:  80mL OMNIPAQUE  IOHEXOL  350 MG/ML SOLN COMPARISON:  Chest CT 09/12/2013. FINDINGS: Cardiovascular: Ascending aorta is dilated measuring up to 4 cm. Heart is normal in size. There is adequate opacification of the pulmonary arteries to the segmental level. There is no evidence for pulmonary embolism. Mediastinum/Nodes: No enlarged mediastinal, hilar, or axillary lymph nodes. Thyroid gland, trachea, and esophagus demonstrate no significant findings. Lungs/Pleura: Subpleural nodular densities and fissural nodular densities measuring 3 mm or less are unchanged from 2016 compatible with benign etiology. Lungs are otherwise clear. There is no pleural effusion or pneumothorax Upper Abdomen: No acute abnormality. Musculoskeletal: Bilateral breast implants are present. No acute fractures are seen. Review of the MIP images confirms the above findings. IMPRESSION: 1. No evidence for  pulmonary embolism. 2. No acute cardiopulmonary process. 3. 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 Recommend follow-up CT/MR every 12 months and vascular consultation. Electronically Signed   By: Greig Pique M.D.   On: 08/14/2023 18:39   DG Chest 2 View Result Date: 08/14/2023 CLINICAL DATA:  Chest pain and tightness radiating to the left shoulder and back. EXAM: CHEST - 2 VIEW COMPARISON:  CT 09/12/2013 FINDINGS: Heart size is normal. Mediastinal shadows are normal. The lungs are clear. No bronchial thickening. No infiltrate, mass, effusion or collapse. Pulmonary vascularity is normal. No bony abnormality. IMPRESSION: Normal chest. Electronically Signed   By: Oneil Officer M.D.   On: 08/14/2023 16:16     Microbiology: Results for orders placed or performed during the hospital encounter of 08/14/23  Surgical PCR screen     Status: None   Collection Time: 08/15/23 11:00 PM   Specimen: Nasal Mucosa; Nasal Swab  Result Value Ref Range Status   MRSA, PCR NEGATIVE NEGATIVE Final   Staphylococcus aureus NEGATIVE NEGATIVE Final    Comment: (NOTE) The Xpert SA Assay (FDA approved for NASAL specimens in patients 75 years of age and older), is one component of a comprehensive surveillance program. It is not intended to diagnose infection nor to guide or monitor treatment. Performed at [facility], [address] [place], [place] [address]     Labs: CBC: Recent Labs  Lab 08/14/23 1545 08/15/23 0551 08/16/23 0444 08/17/23 0314  WBC 9.2 5.8 6.1 7.1  HGB 12.9 11.1* 11.4* 10.6*  HCT 39.1 33.1* 34.2* 32.1*  MCV 91.6 89.5 88.8 91.5  PLT 319 271 278 252   Basic Metabolic Panel: Recent Labs  Lab 08/14/23 1545 08/15/23 0551 08/16/23 0443 08/17/23 0314  NA 138 140 136 140  K 4.5 3.5 4.0 4.2  CL 99 102 101 108  CO2 29 27 26 26   GLUCOSE 113* 92 92 91  BUN 20 17 15 12   CREATININE 1.02* 0.93 0.91 0.86  CALCIUM  10.0 8.9 8.2* 8.4*  MG  --  1.8  --   --    Liver Function Tests: No results for input(s): AST, ALT, ALKPHOS, BILITOT, PROT, ALBUMIN in the last 168 hours. CBG: No results for input(s): GLUCAP in the last 168 hours.  Discharge time spent: greater than 30 minutes.  This record has been created using Conservation officer, historic buildings. Errors have been sought and corrected,but may not always be located. Such creation errors do not reflect on the standard of care.   Signed: Amaryllis Dare, MD [facility] 08/17/2023

## 2023-08-20 LAB — LIPOPROTEIN A (LPA): Lipoprotein (a): 32.8 nmol/L — ABNORMAL HIGH (ref <75.0)

## 2023-08-21 DIAGNOSIS — Z955 Presence of coronary angioplasty implant and graft: Secondary | ICD-10-CM | POA: Insufficient documentation

## 2023-08-29 ENCOUNTER — Encounter: Payer: 59 | Attending: Cardiology | Admitting: *Deleted

## 2023-08-29 DIAGNOSIS — Z955 Presence of coronary angioplasty implant and graft: Secondary | ICD-10-CM | POA: Insufficient documentation

## 2023-08-29 NOTE — Progress Notes (Signed)
Initial phone call completed. Diagnosis can be found in [facility] 1/9. EP Orientation scheduled for Monday 1/27 at 9am.

## 2023-09-03 VITALS — Ht 65.25 in | Wt 150.3 lb

## 2023-09-03 DIAGNOSIS — Z955 Presence of coronary angioplasty implant and graft: Secondary | ICD-10-CM

## 2023-09-03 NOTE — Progress Notes (Signed)
Assessment start time: 10:00 AM  Digestive issues/concerns: no known food allergies   24-hours Recall: B: honey nut cheerios OR plain yogurt L: sandwich - deli meat OR peanut butter crackers D: spaghetti OR tacos   Beverages water (48oz), diet mt dews (3 cans) Alcohol, sometimes but not often  Education r/t nutrition plan Patient is drinking ~48oz of water, she reports she drinks more when she is active. Drinks ~3 cans of diet mt dew. Says this is her sweet tooth satisfier. She is on zepbound and has done well with it for many months. She does eat smaller portions, getting full quickly. Spoke with her about prioritizing quality over quantity. Provided handout on small but nutrient dense foods to incorporate more as a way to ensure she meets her nutritional goals while eating smaller more frequent meals. She is a many years member of weight watchers and feels confident with their system but knows she will need to read labels more carefully while she learns to eat heart healthy as well. Provided some target goals for her day with regards to sodium and saturated fat. Walked through a food label of some of her common foods breaking down how to evaluate how heart healthy they are. Provided mediterranean diet handout and answered questions around heart healthy eating.   Goal 1: Drink 64oz of water per day Goal 2: Eat 15-30gProtein and 30-60gCarbs at each meal. Goal 3: Read labels and reduce sodium intake to below 1500mg  per day.   End time 10:43 AM

## 2023-09-03 NOTE — Patient Instructions (Signed)
Patient Instructions  Patient Details  Name: Chelsea Greene MRN: [id number] Date of Birth: [birth year] Referring Provider:  Marcina Millard, MD  Below are your personal goals for exercise, nutrition, and risk factors. Our goal is to help you stay on track towards obtaining and maintaining these goals. We will be discussing your progress on these goals with you throughout the program.  Initial Exercise Prescription:  Initial Exercise Prescription - 09/03/23 1500       Date of Initial Exercise RX and Referring Provider   Date 09/03/23    Referring Provider Dr. Marcina Millard, MD      Oxygen   Maintain Oxygen Saturation 88% or higher      Treadmill   MPH 2.8    Grade 1    Minutes 15    METs 3.53      Elliptical   Level 1    Speed 3    Minutes 15    METs 4.19      REL-XR   Level 4    Speed 50    Minutes 15    METs 4.19      T5 Nustep   Level 3    SPM 80    Minutes 15    METs 4.19      Prescription Details   Frequency (times per week) 3    Duration Progress to 30 minutes of continuous aerobic without signs/symptoms of physical distress      Intensity   THRR 40-80% of Max Heartrate 112-148    Ratings of Perceived Exertion 11-13    Perceived Dyspnea 0-4      Progression   Progression Continue to progress workloads to maintain intensity without signs/symptoms of physical distress.      Resistance Training   Training Prescription Yes    Weight 7 lb    Reps 10-15             Exercise Goals: Frequency: Be able to perform aerobic exercise two to three times per week in program working toward 2-5 days per week of home exercise.  Intensity: Work with a perceived exertion of 11 (fairly light) - 15 (hard) while following your exercise prescription.  We will make changes to your prescription with you as you progress through the program.   Duration: Be able to do 30 to 45 minutes of continuous aerobic exercise in addition to a 5 minute warm-up and a 5  minute cool-down routine.   Nutrition Goals: Your personal nutrition goals will be established when you do your nutrition analysis with the dietician.  The following are general nutrition guidelines to follow: Cholesterol < 200mg /day Sodium < 1500mg /day Fiber: Women over 50 yrs - 21 grams per day  Personal Goals:  Personal Goals and Risk Factors at Admission - 08/29/23 1041       Core Components/Risk Factors/Patient Goals on Admission   Hypertension Yes    Intervention Provide education on lifestyle modifcations including regular physical activity/exercise, weight management, moderate sodium restriction and increased consumption of fresh fruit, vegetables, and low fat dairy, alcohol moderation, and smoking cessation.;Monitor prescription use compliance.    Expected Outcomes Short Term: Continued assessment and intervention until BP is < 140/88mm HG in hypertensive participants. < 130/72mm HG in hypertensive participants with diabetes, heart failure or chronic kidney disease.;Long Term: Maintenance of blood pressure at goal levels.    Lipids Yes    Intervention Provide education and support for participant on nutrition & aerobic/resistive exercise along with prescribed medications to achieve  LDL 70mg , HDL >40mg .    Expected Outcomes Short Term: Participant states understanding of desired cholesterol values and is compliant with medications prescribed. Participant is following exercise prescription and nutrition guidelines.;Long Term: Cholesterol controlled with medications as prescribed, with individualized exercise RX and with personalized nutrition plan. Value goals: LDL < 70mg , HDL > 40 mg.             Exercise Goals and Review:  Exercise Goals     Row Name 09/03/23 1518             Exercise Goals   Increase Physical Activity Yes       Intervention Provide advice, education, support and counseling about physical activity/exercise needs.;Develop an individualized exercise  prescription for aerobic and resistive training based on initial evaluation findings, risk stratification, comorbidities and participant's personal goals.       Expected Outcomes Short Term: Attend rehab on a regular basis to increase amount of physical activity.;Long Term: Add in home exercise to make exercise part of routine and to increase amount of physical activity.;Long Term: Exercising regularly at least 3-5 days a week.       Increase Strength and Stamina Yes       Intervention Provide advice, education, support and counseling about physical activity/exercise needs.;Develop an individualized exercise prescription for aerobic and resistive training based on initial evaluation findings, risk stratification, comorbidities and participant's personal goals.       Expected Outcomes Short Term: Increase workloads from initial exercise prescription for resistance, speed, and METs.;Short Term: Perform resistance training exercises routinely during rehab and add in resistance training at home;Long Term: Improve cardiorespiratory fitness, muscular endurance and strength as measured by increased METs and functional capacity ( )       Able to understand and use rate of perceived exertion (RPE) scale Yes       Intervention Provide education and explanation on how to use RPE scale       Expected Outcomes Short Term: Able to use RPE daily in rehab to express subjective intensity level;Long Term:  Able to use RPE to guide intensity level when exercising independently       Able to understand and use Dyspnea scale Yes       Intervention Provide education and explanation on how to use Dyspnea scale       Expected Outcomes Short Term: Able to use Dyspnea scale daily in rehab to express subjective sense of shortness of breath during exertion;Long Term: Able to use Dyspnea scale to guide intensity level when exercising independently       Knowledge and understanding of Target Heart Rate Range (THRR) Yes        Intervention Provide education and explanation of THRR including how the numbers were predicted and where they are located for reference       Expected Outcomes Short Term: Able to state/look up THRR;Long Term: Able to use THRR to govern intensity when exercising independently;Short Term: Able to use daily as guideline for intensity in rehab       Able to check pulse independently Yes       Intervention Provide education and demonstration on how to check pulse in carotid and radial arteries.;Review the importance of being able to check your own pulse for safety during independent exercise       Expected Outcomes Short Term: Able to explain why pulse checking is important during independent exercise;Long Term: Able to check pulse independently and accurately       Understanding  of Exercise Prescription Yes       Intervention Provide education, explanation, and written materials on patient's individual exercise prescription       Expected Outcomes Short Term: Able to explain program exercise prescription;Long Term: Able to explain home exercise prescription to exercise independently

## 2023-09-03 NOTE — Progress Notes (Signed)
Cardiac Individual Treatment Plan  Patient Details  Name: Chelsea Greene MRN: [id number] Date of Birth: [birth year] Referring Provider:   Flowsheet Row Cardiac Rehab from 09/03/2023 in [facility] Cardiac and Pulmonary Rehab  Referring Provider Dr. Marcina Millard, MD       Initial Encounter Date:  Flowsheet Row Cardiac Rehab from 09/03/2023 in [facility] Cardiac and Pulmonary Rehab  Date 09/03/23       Visit Diagnosis: Status post coronary artery stent placement  Patient's Home Medications on Admission:  Current Outpatient Medications:    aspirin 81 MG chewable tablet, Chew 1 tablet (81 mg total) by mouth daily., Disp: 90 tablet, Rfl: 3   estradiol (ESTRACE) 0.5 MG tablet, Take 1 tablet by mouth daily., Disp: , Rfl:    Fe Fum-Vit C-Vit B12-FA (TRIGELS-F FORTE) CAPS capsule, Take 1 capsule by mouth daily after breakfast., Disp: 90 capsule, Rfl: 0   hydrocortisone (PROCTOSOL HC) 2.5 % rectal cream, Proctosol HC 2.5 % topical cream perineal applicator  APPLY SPARINGLY TO AFFECTED AREA 2 TO 4 TIMES A DAY, Disp: , Rfl:    levothyroxine (SYNTHROID) 88 MCG tablet, Take 88 mcg by mouth daily before breakfast., Disp: , Rfl:    metoprolol succinate (TOPROL-XL) 25 MG 24 hr tablet, Take 0.5 tablets (12.5 mg total) by mouth daily., Disp: 30 tablet, Rfl: 2   MIMVEY 1-0.5 MG tablet, TAKE 1 TABLET(S) EVERY DAY BY ORAL ROUTE., Disp: , Rfl: 12   prasugrel (EFFIENT) 10 MG TABS tablet, Take 1 tablet (10 mg total) by mouth daily., Disp: 90 tablet, Rfl: 3   rosuvastatin (CRESTOR) 40 MG tablet, Take 1 tablet (40 mg total) by mouth daily., Disp: 90 tablet, Rfl: 2   valACYclovir (VALTREX) 500 MG tablet, Take 500 mg by mouth 2 (two) times daily as needed., Disp: , Rfl:    ZEPBOUND 12.5 MG/0.5ML Pen, Inject 12.5 mg into the skin once a week., Disp: , Rfl:   Past Medical History: Past Medical History:  Diagnosis Date   Anxiety    Hypertension    Medical history non-contributory    Varicose veins of both lower  extremities     Tobacco Use: Social History   Tobacco Use  Smoking Status Never  Smokeless Tobacco Never    Labs: Review Flowsheet       Latest Ref Rng & Units 08/15/2023  Labs for ITP Cardiac and Pulmonary Rehab  Cholestrol 0 - 200 mg/dL 409   LDL (calc) 0 - 99 mg/dL 811   HDL-C >91 mg/dL 71   Trlycerides <478 mg/dL 54   Hemoglobin G9F 4.8 - 5.6 % 5.2      Exercise Target Goals: Exercise Program Goal: Individual exercise prescription set using results from initial 6 min walk test and THRR while considering  patient's activity barriers and safety.   Exercise Prescription Goal: Initial exercise prescription builds to 30-45 minutes a day of aerobic activity, 2-3 days per week.  Home exercise guidelines will be given to patient during program as part of exercise prescription that the participant will acknowledge.   Education: Aerobic Exercise: - Group verbal and visual presentation on the components of exercise prescription. Introduces F.I.T.T principle from ACSM for exercise prescriptions.  Reviews F.I.T.T. principles of aerobic exercise including progression. Written material given at graduation.   Education: Resistance Exercise: - Group verbal and visual presentation on the components of exercise prescription. Introduces F.I.T.T principle from ACSM for exercise prescriptions  Reviews F.I.T.T. principles of resistance exercise including progression. Written material given at graduation.  Education: Exercise & Equipment Safety: - Individual verbal instruction and demonstration of equipment use and safety with use of the equipment. Flowsheet Row Cardiac Rehab from 09/03/2023 in [facility] Cardiac and Pulmonary Rehab  Date 09/03/23  Educator NT  Instruction Review Code 1- Verbalizes Understanding       Education: Exercise Physiology & General Exercise Guidelines: - Group verbal and written instruction with models to review the exercise physiology of the cardiovascular system  and associated critical values. Provides general exercise guidelines with specific guidelines to those with heart or lung disease.    Education: Flexibility, Balance, Mind/Body Relaxation: - Group verbal and visual presentation with interactive activity on the components of exercise prescription. Introduces F.I.T.T principle from ACSM for exercise prescriptions. Reviews F.I.T.T. principles of flexibility and balance exercise training including progression. Also discusses the mind body connection.  Reviews various relaxation techniques to help reduce and manage stress (i.e. Deep breathing, progressive muscle relaxation, and visualization). Balance handout provided to take home. Written material given at graduation.   Activity Barriers & Risk Stratification:  Activity Barriers & Cardiac Risk Stratification - 08/29/23 1037       Activity Barriers & Cardiac Risk Stratification   Activity Barriers Other (comment);Neck/Spine Problems    Comments left leg numb    Cardiac Risk Stratification Moderate             6 Minute Walk:  6 Minute Walk     Row Name 09/03/23 1519         6 Minute Walk   Phase Initial     Distance 1450 feet     Walk Time 6 minutes     # of Rest Breaks 0     MPH 2.75     METS 4.19     RPE 9     Perceived Dyspnea  0     VO2 Peak 14.66     Symptoms No     Resting HR 76 bpm     Resting BP 112/62     Resting Oxygen Saturation  100 %     Exercise Oxygen Saturation  during 6 min walk 99 %     Max Ex. HR 98 bpm     Max Ex. BP 132/70     2 Minute Post BP 120/66              Oxygen Initial Assessment:   Oxygen Re-Evaluation:   Oxygen Discharge (Final Oxygen Re-Evaluation):   Initial Exercise Prescription:  Initial Exercise Prescription - 09/03/23 1500       Date of Initial Exercise RX and Referring Provider   Date 09/03/23    Referring Provider Dr. Marcina Millard, MD      Oxygen   Maintain Oxygen Saturation 88% or higher      Treadmill    MPH 2.8    Grade 1    Minutes 15    METs 3.53      Elliptical   Level 1    Speed 3    Minutes 15    METs 4.19      REL-XR   Level 4    Speed 50    Minutes 15    METs 4.19      T5 Nustep   Level 3    SPM 80    Minutes 15    METs 4.19      Prescription Details   Frequency (times per week) 3    Duration Progress to 30 minutes of continuous aerobic  without signs/symptoms of physical distress      Intensity   THRR 40-80% of Max Heartrate 112-148    Ratings of Perceived Exertion 11-13    Perceived Dyspnea 0-4      Progression   Progression Continue to progress workloads to maintain intensity without signs/symptoms of physical distress.      Resistance Training   Training Prescription Yes    Weight 7 lb    Reps 10-15             Perform Capillary Blood Glucose checks as needed.  Exercise Prescription Changes:   Exercise Prescription Changes     Row Name 09/03/23 1500             Response to Exercise   Blood Pressure (Admit) 112/62       Blood Pressure (Exercise) 132/70       Blood Pressure (Exit) 120/66       Heart Rate (Admit) 76 bpm       Heart Rate (Exercise) 98 bpm       Heart Rate (Exit) 70 bpm       Oxygen Saturation (Admit) 100 %       Oxygen Saturation (Exercise) 99 %       Rating of Perceived Exertion (Exercise) 9       Perceived Dyspnea (Exercise) 0       Symptoms none       Comments Results                Exercise Comments:   Exercise Goals and Review:   Exercise Goals     Row Name 09/03/23 1518             Exercise Goals   Increase Physical Activity Yes       Intervention Provide advice, education, support and counseling about physical activity/exercise needs.;Develop an individualized exercise prescription for aerobic and resistive training based on initial evaluation findings, risk stratification, comorbidities and participant's personal goals.       Expected Outcomes Short Term: Attend rehab on a regular  basis to increase amount of physical activity.;Long Term: Add in home exercise to make exercise part of routine and to increase amount of physical activity.;Long Term: Exercising regularly at least 3-5 days a week.       Increase Strength and Stamina Yes       Intervention Provide advice, education, support and counseling about physical activity/exercise needs.;Develop an individualized exercise prescription for aerobic and resistive training based on initial evaluation findings, risk stratification, comorbidities and participant's personal goals.       Expected Outcomes Short Term: Increase workloads from initial exercise prescription for resistance, speed, and METs.;Short Term: Perform resistance training exercises routinely during rehab and add in resistance training at home;Long Term: Improve cardiorespiratory fitness, muscular endurance and strength as measured by increased METs and functional capacity ( )       Able to understand and use rate of perceived exertion (RPE) scale Yes       Intervention Provide education and explanation on how to use RPE scale       Expected Outcomes Short Term: Able to use RPE daily in rehab to express subjective intensity level;Long Term:  Able to use RPE to guide intensity level when exercising independently       Able to understand and use Dyspnea scale Yes       Intervention Provide education and explanation on how to use Dyspnea scale  Expected Outcomes Short Term: Able to use Dyspnea scale daily in rehab to express subjective sense of shortness of breath during exertion;Long Term: Able to use Dyspnea scale to guide intensity level when exercising independently       Knowledge and understanding of Target Heart Rate Range (THRR) Yes       Intervention Provide education and explanation of THRR including how the numbers were predicted and where they are located for reference       Expected Outcomes Short Term: Able to state/look up THRR;Long Term: Able to use  THRR to govern intensity when exercising independently;Short Term: Able to use daily as guideline for intensity in rehab       Able to check pulse independently Yes       Intervention Provide education and demonstration on how to check pulse in carotid and radial arteries.;Review the importance of being able to check your own pulse for safety during independent exercise       Expected Outcomes Short Term: Able to explain why pulse checking is important during independent exercise;Long Term: Able to check pulse independently and accurately       Understanding of Exercise Prescription Yes       Intervention Provide education, explanation, and written materials on patient's individual exercise prescription       Expected Outcomes Short Term: Able to explain program exercise prescription;Long Term: Able to explain home exercise prescription to exercise independently                Exercise Goals Re-Evaluation :   Discharge Exercise Prescription (Final Exercise Prescription Changes):  Exercise Prescription Changes - 09/03/23 1500       Response to Exercise   Blood Pressure (Admit) 112/62    Blood Pressure (Exercise) 132/70    Blood Pressure (Exit) 120/66    Heart Rate (Admit) 76 bpm    Heart Rate (Exercise) 98 bpm    Heart Rate (Exit) 70 bpm    Oxygen Saturation (Admit) 100 %    Oxygen Saturation (Exercise) 99 %    Rating of Perceived Exertion (Exercise) 9    Perceived Dyspnea (Exercise) 0    Symptoms none    Comments Results             Nutrition:  Target Goals: Understanding of nutrition guidelines, daily intake of sodium 1500mg , cholesterol 200mg , calories 30% from fat and 7% or less from saturated fats, daily to have 5 or more servings of fruits and vegetables.  Education: All About Nutrition: -Group instruction provided by verbal, written material, interactive activities, discussions, models, and posters to present general guidelines for heart healthy nutrition  including fat, fiber, MyPlate, the role of sodium in heart healthy nutrition, utilization of the nutrition label, and utilization of this knowledge for meal planning. Follow up email sent as well. Written material given at graduation.   Biometrics:  Pre Biometrics - 09/03/23 1510       Pre Biometrics   Height 5' 5.25" (1.657 m)    Weight 150 lb 4.8 oz (68.2 kg)    Waist Circumference 31.5 inches    Hip Circumference 39 inches    Waist to Hip Ratio 0.81 %    BMI (Calculated) 24.83    Single Leg Stand 30 seconds              Nutrition Therapy Plan and Nutrition Goals:  Nutrition Therapy & Goals - 09/03/23 1402       Nutrition Therapy   Diet Cardiac  Protein (specify units) 75-90g    Fiber 30 grams    Whole Grain Foods 3 servings    Saturated Fats 15 max. grams    Fruits and Vegetables 5 servings/day    Sodium 2 grams      Personal Nutrition Goals   Nutrition Goal Drink 64oz of water per day    Personal Goal #2 Eat 15-30gProtein and 30-60gCarbs at each meal.    Personal Goal #3 Read labels and reduce sodium intake to below 1500mg  per day.    Comments Patient is drinking ~48oz of water, she reports she drinks more when she is active. Drinks ~3 cans of diet mt dew. Says this is her sweet tooth satisfier. She is on zepbound and has done well with it for many months. She does eat smaller portions, getting full quickly. Spoke with her about prioritizing quality over quantity. Provided handout on small but nutrient dense foods to incorporate more as a way to ensure she meets her nutritional goals while eating smaller more frequent meals. She is a many years member of weight watchers and feels confident with their system but knows she will need to read labels more carefully while she learns to eat heart healthy as well. Provided some target goals for her day with regards to sodium and saturated fat. Walked through a food label of some of her common foods breaking down how to evaluate  how heart healthy they are. Provided mediterranean diet handout and answered questions around heart healthy eating.      Intervention Plan   Intervention Prescribe, educate and counsel regarding individualized specific dietary modifications aiming towards targeted core components such as weight, hypertension, lipid management, diabetes, heart failure and other comorbidities.;Nutrition handout(s) given to patient.    Expected Outcomes Short Term Goal: Understand basic principles of dietary content, such as calories, fat, sodium, cholesterol and nutrients.;Short Term Goal: A plan has been developed with personal nutrition goals set during dietitian appointment.;Long Term Goal: Adherence to prescribed nutrition plan.             Nutrition Assessments:  MEDIFICTS Score Key: >=70 Need to make dietary changes  40-70 Heart Healthy Diet <= 40 Therapeutic Level Cholesterol Diet  Flowsheet Row Cardiac Rehab from 09/03/2023 in [facility] Cardiac and Pulmonary Rehab  Picture Your Plate Total Score on Admission 68      Picture Your Plate Scores: <04 Unhealthy dietary pattern with much room for improvement. 41-50 Dietary pattern unlikely to meet recommendations for good health and room for improvement. 51-60 More healthful dietary pattern, with some room for improvement.  >60 Healthy dietary pattern, although there may be some specific behaviors that could be improved.    Nutrition Goals Re-Evaluation:   Nutrition Goals Discharge (Final Nutrition Goals Re-Evaluation):   Psychosocial: Target Goals: Acknowledge presence or absence of significant depression and/or stress, maximize coping skills, provide positive support system. Participant is able to verbalize types and ability to use techniques and skills needed for reducing stress and depression.   Education: Stress, Anxiety, and Depression - Group verbal and visual presentation to define topics covered.  Reviews how body is impacted by stress,  anxiety, and depression.  Also discusses healthy ways to reduce stress and to treat/manage anxiety and depression.  Written material given at graduation.   Education: Sleep Hygiene -Provides group verbal and written instruction about how sleep can affect your health.  Define sleep hygiene, discuss sleep cycles and impact of sleep habits. Review good sleep hygiene tips.    Initial  Review & Psychosocial Screening:  Initial Psych Review & Screening - 08/29/23 1043       Initial Review   Current issues with Current Stress Concerns      Family Dynamics   Good Support System? Yes   husband and children, friends     Barriers   Psychosocial barriers to participate in program There are no identifiable barriers or psychosocial needs.;The patient should benefit from training in stress management and relaxation.      Screening Interventions   Interventions Encouraged to exercise;Provide feedback about the scores to participant;To provide support and resources with identified psychosocial needs    Expected Outcomes Short Term goal: Utilizing psychosocial counselor, staff and physician to assist with identification of specific Stressors or current issues interfering with healing process. Setting desired goal for each stressor or current issue identified.;Long Term Goal: Stressors or current issues are controlled or eliminated.;Short Term goal: Identification and review with participant of any Quality of Life or Depression concerns found by scoring the questionnaire.;Long Term goal: The participant improves quality of Life and PHQ9 Scores as seen by post scores and/or verbalization of changes             Quality of Life Scores:   Quality of Life - 09/03/23 1030       Quality of Life   Select Quality of Life      Quality of Life Scores   Health/Function Pre 24.03 %    Socioeconomic Pre 21.56 %    Psych/Spiritual Pre 24.71 %    Family Pre 27.1 %    GLOBAL Pre 24.04 %            Scores  of 19 and below usually indicate a poorer quality of life in these areas.  A difference of  2-3 points is a clinically meaningful difference.  A difference of 2-3 points in the total score of the Quality of Life Index has been associated with significant improvement in overall quality of life, self-image, physical symptoms, and general health in studies assessing change in quality of life.  PHQ-9: Review Flowsheet       09/03/2023  Depression screen PHQ 2/9  Decreased Interest 0  Down, Depressed, Hopeless 0  PHQ - 2 Score 0  Altered sleeping 0  Tired, decreased energy 1  Change in appetite 0  Feeling bad or failure about yourself  0  Trouble concentrating 0  Moving slowly or fidgety/restless 0  Suicidal thoughts 0  PHQ-9 Score 1  Difficult doing work/chores Not difficult at all   Interpretation of Total Score  Total Score Depression Severity:  1-4 = Minimal depression, 5-9 = Mild depression, 10-14 = Moderate depression, 15-19 = Moderately severe depression, 20-27 = Severe depression   Psychosocial Evaluation and Intervention:  Psychosocial Evaluation - 08/29/23 1049       Psychosocial Evaluation & Interventions   Interventions Encouraged to exercise with the program and follow exercise prescription;Stress management education;Relaxation education    Comments Drue Flirt is coming to cardiac rehab after stent placement. She has been exercising regularly prior to the stent and wants to get back to feeling confident exercising herself. She states initially her stress level was high after being discharged because she was hesistant to do her normal things in case she hurt her heart and felt that her "normal life" has been changed forever. Her stress level has decreased with each day, but she is looking forward to the education and monitoring in the program. She works from home  and is thankful to be back to her typical work schedule. Her husband, children, and friends are a great support system.     Expected Outcomes Short: attend cardiac rehab for education and exercise. Long: develop and maintain positive self care habits    Continue Psychosocial Services  Follow up required by staff             Psychosocial Re-Evaluation:   Psychosocial Discharge (Final Psychosocial Re-Evaluation):   Vocational Rehabilitation: Provide vocational rehab assistance to qualifying candidates.   Vocational Rehab Evaluation & Intervention:  Vocational Rehab - 08/29/23 1043       Initial Vocational Rehab Evaluation & Intervention   Assessment shows need for Vocational Rehabilitation No             Education: Education Goals: Education classes will be provided on a variety of topics geared toward better understanding of heart health and risk factor modification. Participant will state understanding/return demonstration of topics presented as noted by education test scores.  Learning Barriers/Preferences:  Learning Barriers/Preferences - 08/29/23 1042       Learning Barriers/Preferences   Learning Barriers None    Learning Preferences None             General Cardiac Education Topics:  AED/CPR: - Group verbal and written instruction with the use of models to demonstrate the basic use of the AED with the basic ABC's of resuscitation.   Anatomy and Cardiac Procedures: - Group verbal and visual presentation and models provide information about basic cardiac anatomy and function. Reviews the testing methods done to diagnose heart disease and the outcomes of the test results. Describes the treatment choices: Medical Management, Angioplasty, or Coronary Bypass Surgery for treating various heart conditions including Myocardial Infarction, Angina, Valve Disease, and Cardiac Arrhythmias.  Written material given at graduation.   Medication Safety: - Group verbal and visual instruction to review commonly prescribed medications for heart and lung disease. Reviews the medication, class  of the drug, and side effects. Includes the steps to properly store meds and maintain the prescription regimen.  Written material given at graduation.   Intimacy: - Group verbal instruction through game format to discuss how heart and lung disease can affect sexual intimacy. Written material given at graduation..   Know Your Numbers and Heart Failure: - Group verbal and visual instruction to discuss disease risk factors for cardiac and pulmonary disease and treatment options.  Reviews associated critical values for Overweight/Obesity, Hypertension, Cholesterol, and Diabetes.  Discusses basics of heart failure: signs/symptoms and treatments.  Introduces Heart Failure Zone chart for action plan for heart failure.  Written material given at graduation. Flowsheet Row Cardiac Rehab from 09/03/2023 in [facility] Cardiac and Pulmonary Rehab  Education need identified 09/03/23       Infection Prevention: - Provides verbal and written material to individual with discussion of infection control including proper hand washing and proper equipment cleaning during exercise session. Flowsheet Row Cardiac Rehab from 09/03/2023 in [facility] Cardiac and Pulmonary Rehab  Date 09/03/23  Educator NT  Instruction Review Code 1- Verbalizes Understanding       Falls Prevention: - Provides verbal and written material to individual with discussion of falls prevention and safety. Flowsheet Row Cardiac Rehab from 09/03/2023 in [facility] Cardiac and Pulmonary Rehab  Date 09/03/23  Educator NT  Instruction Review Code 1- Verbalizes Understanding       Other: -Provides group and verbal instruction on various topics (see comments)   Knowledge Questionnaire Score:  Knowledge Questionnaire Score -  09/03/23 1030       Knowledge Questionnaire Score   Pre Score 23/26             Core Components/Risk Factors/Patient Goals at Admission:  Personal Goals and Risk Factors at Admission - 08/29/23 1041       Core  Components/Risk Factors/Patient Goals on Admission   Hypertension Yes    Intervention Provide education on lifestyle modifcations including regular physical activity/exercise, weight management, moderate sodium restriction and increased consumption of fresh fruit, vegetables, and low fat dairy, alcohol moderation, and smoking cessation.;Monitor prescription use compliance.    Expected Outcomes Short Term: Continued assessment and intervention until BP is < 140/66mm HG in hypertensive participants. < 130/44mm HG in hypertensive participants with diabetes, heart failure or chronic kidney disease.;Long Term: Maintenance of blood pressure at goal levels.    Lipids Yes    Intervention Provide education and support for participant on nutrition & aerobic/resistive exercise along with prescribed medications to achieve LDL 70mg , HDL >40mg .    Expected Outcomes Short Term: Participant states understanding of desired cholesterol values and is compliant with medications prescribed. Participant is following exercise prescription and nutrition guidelines.;Long Term: Cholesterol controlled with medications as prescribed, with individualized exercise RX and with personalized nutrition plan. Value goals: LDL < 70mg , HDL > 40 mg.             Education:Diabetes - Individual verbal and written instruction to review signs/symptoms of diabetes, desired ranges of glucose level fasting, after meals and with exercise. Acknowledge that pre and post exercise glucose checks will be done for 3 sessions at entry of program.   Core Components/Risk Factors/Patient Goals Review:    Core Components/Risk Factors/Patient Goals at Discharge (Final Review):    ITP Comments:  ITP Comments     Row Name 08/29/23 1035 09/03/23 1030         ITP Comments Initial phone call completed. Diagnosis can be found in [facility] 1/9. EP Orientation scheduled for Monday 1/27 at 9am. Completed and gym orientation. Initial ITP created and  sent for review to Dr. Bethann Punches, Medical Director.               Comments: Initial ITP

## 2023-09-04 DIAGNOSIS — Z955 Presence of coronary angioplasty implant and graft: Secondary | ICD-10-CM

## 2023-09-04 NOTE — Progress Notes (Signed)
Daily Session Note  Patient Details  Name: Chelsea Greene MRN: [id number] Date of Birth: [birth year] Referring Provider:   Flowsheet Row Cardiac Rehab from 09/03/2023 in [facility] Cardiac and Pulmonary Rehab  Referring Provider Dr. Marcina Millard, MD       Encounter Date: 09/04/2023  Check In:  Session Check In - 09/04/23 1126       Check-In   Supervising physician immediately available to respond to emergencies See telemetry face sheet for immediately available ER MD    Location [facility]-Cardiac & Pulmonary Rehab    Staff Present Rory Percy, MS, Exercise Physiologist;Maxon Conetta BS, Exercise Physiologist;Jason Wallace Cullens RDN,LDN;Mckenze Slone, RN, BSN, CCRP;Meredith Craven RN,BSN;Noah Tickle, BS, Exercise Physiologist    Virtual Visit No    Medication changes reported     No    Fall or balance concerns reported    No    Warm-up and Cool-down Performed on first and last piece of equipment    Resistance Training Performed Yes    VAD Patient? No    PAD/SET Patient? No      Pain Assessment   Currently in Pain? No/denies                Social History   Tobacco Use  Smoking Status Never  Smokeless Tobacco Never    Goals Met:  Exercise tolerated well Personal goals reviewed No report of concerns or symptoms today  Goals Unmet:  Not Applicable  Comments: Pt able to follow exercise prescription today without complaint.  Will continue to monitor for progression. First full day of exercise!  Patient was oriented to gym and equipment including functions, settings, policies, and procedures.  Patient's individual exercise prescription and treatment plan were reviewed.  All starting workloads were established based on the results of the 6 minute walk test done at initial orientation visit.  The plan for exercise progression was also introduced and progression will be customized based on patient's performance and goals.    Dr. Bethann Punches is Medical Director for [facility]  Cardiac Rehabilitation.  Dr. Vida Rigger is Medical Director for [facility] Pulmonary Rehabilitation.

## 2023-09-06 ENCOUNTER — Encounter: Payer: 59 | Admitting: *Deleted

## 2023-09-06 DIAGNOSIS — Z955 Presence of coronary angioplasty implant and graft: Secondary | ICD-10-CM

## 2023-09-06 NOTE — Progress Notes (Signed)
Daily Session Note  Patient Details  Name: EUNIE LAWN MRN: [id number] Date of Birth: Sep 27, 1970 Referring Provider:   Flowsheet Row Cardiac Rehab from 09/03/2023 in [facility] Cardiac and Pulmonary Rehab  Referring Provider Dr. Marcina Millard, MD       Encounter Date: 09/06/2023  Check In:  Session Check In - 09/06/23 1531       Check-In   Supervising physician immediately available to respond to emergencies See telemetry face sheet for immediately available ER MD    Location [facility]-Cardiac & Pulmonary Rehab    Staff Present Maxon Suzzette Righter, Exercise Physiologist;Ruqayyah Lute Jewel Baize RN,BSN;Joseph Hood RCP,RRT,BSRT;Noah Tickle, [place], Exercise Physiologist    Virtual Visit No    Medication changes reported     No    Fall or balance concerns reported    No    Warm-up and Cool-down Performed on first and last piece of equipment    Resistance Training Performed Yes    VAD Patient? No    PAD/SET Patient? No      Pain Assessment   Currently in Pain? No/denies                Social History   Tobacco Use  Smoking Status Never  Smokeless Tobacco Never    Goals Met:  Independence with exercise equipment Exercise tolerated well No report of concerns or symptoms today Strength training completed today  Goals Unmet:  Not Applicable  Comments: Pt able to follow exercise prescription today without complaint.  Will continue to monitor for progression.    Dr. Bethann Punches is Medical Director for [facility] Cardiac Rehabilitation.  Dr. Vida Rigger is Medical Director for [facility] Pulmonary Rehabilitation.

## 2023-09-11 ENCOUNTER — Encounter: Payer: 59 | Attending: Cardiology | Admitting: *Deleted

## 2023-09-11 DIAGNOSIS — Z955 Presence of coronary angioplasty implant and graft: Secondary | ICD-10-CM | POA: Insufficient documentation

## 2023-09-11 NOTE — Progress Notes (Signed)
 Daily Session Note  Patient Details  Name: Chelsea Greene MRN: [id number] Date of Birth: 05/19/1971 Referring Provider:   Flowsheet Row Cardiac Rehab from 09/03/2023 in [facility] Cardiac and Pulmonary Rehab  Referring Provider Dr. Marsa Dooms, MD       Encounter Date: 09/11/2023  Check In:  Session Check In - 09/11/23 1121       Check-In   Supervising physician immediately available to respond to emergencies See telemetry face sheet for immediately available ER MD    Location [facility]-Cardiac & Pulmonary Rehab    Staff Present Rollene Paterson, MS, Exercise Physiologist;Maxon Conetta BS, Exercise Physiologist;Jason Elnor RDN,LDN;Ismerai Bin, RN, BSN, CCRP;Meredith Craven RN,BSN;Noah Tickle, BS, Exercise Physiologist    Virtual Visit No    Medication changes reported     No    Fall or balance concerns reported    No    Warm-up and Cool-down Performed on first and last piece of equipment    Resistance Training Performed Yes    VAD Patient? No    PAD/SET Patient? No      Pain Assessment   Currently in Pain? No/denies                Social History   Tobacco Use  Smoking Status Never  Smokeless Tobacco Never    Goals Met:  Independence with exercise equipment Exercise tolerated well No report of concerns or symptoms today  Goals Unmet:  Not Applicable  Comments: Pt able to follow exercise prescription today without complaint.  Will continue to monitor for progression.    Dr. Oneil Pinal is Medical Director for [facility] Cardiac Rehabilitation.  Dr. Fuad Aleskerov is Medical Director for [facility] Pulmonary Rehabilitation.

## 2023-09-13 ENCOUNTER — Encounter: Payer: 59 | Admitting: *Deleted

## 2023-09-13 DIAGNOSIS — Z955 Presence of coronary angioplasty implant and graft: Secondary | ICD-10-CM

## 2023-09-13 NOTE — Progress Notes (Signed)
 Daily Session Note  Patient Details  Name: Chelsea Greene MRN: [id number] Date of Birth: 1970-10-14 Referring Provider:   Flowsheet Row Cardiac Rehab from 09/03/2023 in [facility] Cardiac and Pulmonary Rehab  Referring Provider Dr. Marsa Dooms, MD       Encounter Date: 09/13/2023  Check In:  Session Check In - 09/13/23 1117       Check-In   Supervising physician immediately available to respond to emergencies See telemetry face sheet for immediately available ER MD    Location [facility]-Cardiac & Pulmonary Rehab    Staff Present Othel Durand, RN, BSN, CCRP;Meredith Tressa RN,BSN;Noah Tickle, BS, Exercise Physiologist;Maxon Conetta BS, Exercise Physiologist;Joseph Hood RCP,RRT,BSRT    Virtual Visit No    Medication changes reported     No    Fall or balance concerns reported    No    Warm-up and Cool-down Performed on first and last piece of equipment    Resistance Training Performed Yes    VAD Patient? No    PAD/SET Patient? No      Pain Assessment   Currently in Pain? No/denies                Social History   Tobacco Use  Smoking Status Never  Smokeless Tobacco Never    Goals Met:  Independence with exercise equipment Exercise tolerated well No report of concerns or symptoms today  Goals Unmet:  Not Applicable  Comments: Pt able to follow exercise prescription today without complaint.  Will continue to monitor for progression.    Dr. Oneil Pinal is Medical Director for [facility] Cardiac Rehabilitation.  Dr. Fuad Aleskerov is Medical Director for [facility] Pulmonary Rehabilitation.

## 2023-09-17 ENCOUNTER — Encounter: Payer: 59 | Admitting: *Deleted

## 2023-09-17 DIAGNOSIS — Z955 Presence of coronary angioplasty implant and graft: Secondary | ICD-10-CM

## 2023-09-17 NOTE — Progress Notes (Signed)
 Daily Session Note  Patient Details  Name: Chelsea Greene MRN: [id number] Date of Birth: July 03, 1971 Referring Provider:   Flowsheet Row Cardiac Rehab from 09/03/2023 in [facility] Cardiac and Pulmonary Rehab  Referring Provider Dr. Percival Brace, MD       Encounter Date: 09/17/2023  Check In:  Session Check In - 09/17/23 1542       Check-In   Supervising physician immediately available to respond to emergencies See telemetry face sheet for immediately available ER MD    Location [facility]-Cardiac & Pulmonary Rehab    Staff Present Sue Em RN,BSN;Maxon Conetta BS, Exercise Physiologist;Joseph Nancey Awkward;Maud Sorenson, RN, BSN, CCRP    Virtual Visit No    Medication changes reported     No    Fall or balance concerns reported    No    Warm-up and Cool-down Performed on first and last piece of equipment    Resistance Training Performed Yes    VAD Patient? No    PAD/SET Patient? No      Pain Assessment   Currently in Pain? No/denies                Social History   Tobacco Use  Smoking Status Never  Smokeless Tobacco Never    Goals Met:  Independence with exercise equipment Exercise tolerated well No report of concerns or symptoms today Strength training completed today  Goals Unmet:  Not Applicable  Comments: Pt able to follow exercise prescription today without complaint.  Will continue to monitor for progression.    Dr. Firman Hughes is Medical Director for [facility] Cardiac Rehabilitation.  Dr. Fuad Aleskerov is Medical Director for [facility] Pulmonary Rehabilitation.

## 2023-09-18 ENCOUNTER — Encounter: Payer: 59 | Admitting: *Deleted

## 2023-09-18 DIAGNOSIS — Z955 Presence of coronary angioplasty implant and graft: Secondary | ICD-10-CM | POA: Diagnosis not present

## 2023-09-18 NOTE — Progress Notes (Signed)
Daily Session Note  Patient Details  Name: MEGAHN KILLINGS MRN: [id number] Date of Birth: 27-Oct-1970 Referring Provider:   Flowsheet Row Cardiac Rehab from 09/03/2023 in [facility] Cardiac and Pulmonary Rehab  Referring Provider Dr. Marcina Millard, MD       Encounter Date: 09/18/2023  Check In:  Session Check In - 09/18/23 1119       Check-In   Supervising physician immediately available to respond to emergencies See telemetry face sheet for immediately available ER MD    Location [facility]-Cardiac & Pulmonary Rehab    Staff Present Rory Percy, MS, Exercise Physiologist;Daiton Cowles, RN, BSN, CCRP;Maxon Conetta BS, Exercise Physiologist;Jason Wallace Cullens RDN,LDN;Meredith Craven RN,BSN;Noah Tickle, BS, Exercise Physiologist    Virtual Visit No    Medication changes reported     No    Fall or balance concerns reported    No    Warm-up and Cool-down Performed on first and last piece of equipment    Resistance Training Performed Yes    VAD Patient? No    PAD/SET Patient? No      Pain Assessment   Currently in Pain? No/denies                Social History   Tobacco Use  Smoking Status Never  Smokeless Tobacco Never    Goals Met:  Independence with exercise equipment Exercise tolerated well No report of concerns or symptoms today  Goals Unmet:  Not Applicable  Comments: Pt able to follow exercise prescription today without complaint.  Will continue to monitor for progression.    Dr. Bethann Punches is Medical Director for [facility] Cardiac Rehabilitation.  Dr. Vida Rigger is Medical Director for [facility] Pulmonary Rehabilitation.

## 2023-09-19 DIAGNOSIS — Z955 Presence of coronary angioplasty implant and graft: Secondary | ICD-10-CM

## 2023-09-19 NOTE — Progress Notes (Signed)
Cardiac Individual Treatment Plan  Patient Details  Name: Chelsea Greene MRN: [id number] Date of Birth: 05-04-1971 Referring Provider:   Flowsheet Row Cardiac Rehab from 09/03/2023 in [facility] Cardiac and Pulmonary Rehab  Referring Provider Dr. Marcina Millard, MD       Initial Encounter Date:  Flowsheet Row Cardiac Rehab from 09/03/2023 in [facility] Cardiac and Pulmonary Rehab  Date 09/03/23       Visit Diagnosis: Status post coronary artery stent placement  Patient's Home Medications on Admission:  Current Outpatient Medications:    aspirin 81 MG chewable tablet, Chew 1 tablet (81 mg total) by mouth daily., Disp: 90 tablet, Rfl: 3   estradiol (ESTRACE) 0.5 MG tablet, Take 1 tablet by mouth daily., Disp: , Rfl:    Fe Fum-Vit C-Vit B12-FA (TRIGELS-F FORTE) CAPS capsule, Take 1 capsule by mouth daily after breakfast., Disp: 90 capsule, Rfl: 0   hydrocortisone (PROCTOSOL HC) 2.5 % rectal cream, Proctosol HC 2.5 % topical cream perineal applicator  APPLY SPARINGLY TO AFFECTED AREA 2 TO 4 TIMES A DAY, Disp: , Rfl:    levothyroxine (SYNTHROID) 88 MCG tablet, Take 88 mcg by mouth daily before breakfast., Disp: , Rfl:    metoprolol succinate (TOPROL-XL) 25 MG 24 hr tablet, Take 0.5 tablets (12.5 mg total) by mouth daily., Disp: 30 tablet, Rfl: 2   MIMVEY 1-0.5 MG tablet, TAKE 1 TABLET(S) EVERY DAY BY ORAL ROUTE., Disp: , Rfl: 12   prasugrel (EFFIENT) 10 MG TABS tablet, Take 1 tablet (10 mg total) by mouth daily., Disp: 90 tablet, Rfl: 3   rosuvastatin (CRESTOR) 40 MG tablet, Take 1 tablet (40 mg total) by mouth daily., Disp: 90 tablet, Rfl: 2   valACYclovir (VALTREX) 500 MG tablet, Take 500 mg by mouth 2 (two) times daily as needed., Disp: , Rfl:    ZEPBOUND 12.5 MG/0.5ML Pen, Inject 12.5 mg into the skin once a week., Disp: , Rfl:   Past Medical History: Past Medical History:  Diagnosis Date   Anxiety    Hypertension    Medical history non-contributory    Varicose veins of both lower  extremities     Tobacco Use: Social History   Tobacco Use  Smoking Status Never  Smokeless Tobacco Never    Labs: Review Flowsheet       Latest Ref Rng & Units 08/15/2023  Labs for ITP Cardiac and Pulmonary Rehab  Cholestrol 0 - 200 mg/dL 086   LDL (calc) 0 - 99 mg/dL 578   HDL-C >46 mg/dL 71   Trlycerides <962 mg/dL 54   Hemoglobin X5M 4.8 - 5.6 % 5.2      Exercise Target Goals: Exercise Program Goal: Individual exercise prescription set using results from initial 6 min walk test and THRR while considering  patient's activity barriers and safety.   Exercise Prescription Goal: Initial exercise prescription builds to 30-45 minutes a day of aerobic activity, 2-3 days per week.  Home exercise guidelines will be given to patient during program as part of exercise prescription that the participant will acknowledge.   Education: Aerobic Exercise: - Group verbal and visual presentation on the components of exercise prescription. Introduces F.I.T.T principle from ACSM for exercise prescriptions.  Reviews F.I.T.T. principles of aerobic exercise including progression. Written material given at graduation.   Education: Resistance Exercise: - Group verbal and visual presentation on the components of exercise prescription. Introduces F.I.T.T principle from ACSM for exercise prescriptions  Reviews F.I.T.T. principles of resistance exercise including progression. Written material given at graduation.  Education: Exercise & Equipment Safety: - Individual verbal instruction and demonstration of equipment use and safety with use of the equipment. Flowsheet Row Cardiac Rehab from 09/03/2023 in [facility] Cardiac and Pulmonary Rehab  Date 09/03/23  Educator NT  Instruction Review Code 1- Verbalizes Understanding       Education: Exercise Physiology & General Exercise Guidelines: - Group verbal and written instruction with models to review the exercise physiology of the cardiovascular system  and associated critical values. Provides general exercise guidelines with specific guidelines to those with heart or lung disease.    Education: Flexibility, Balance, Mind/Body Relaxation: - Group verbal and visual presentation with interactive activity on the components of exercise prescription. Introduces F.I.T.T principle from ACSM for exercise prescriptions. Reviews F.I.T.T. principles of flexibility and balance exercise training including progression. Also discusses the mind body connection.  Reviews various relaxation techniques to help reduce and manage stress (i.e. Deep breathing, progressive muscle relaxation, and visualization). Balance handout provided to take home. Written material given at graduation.   Activity Barriers & Risk Stratification:  Activity Barriers & Cardiac Risk Stratification - 08/29/23 1037       Activity Barriers & Cardiac Risk Stratification   Activity Barriers Other (comment);Neck/Spine Problems    Comments left leg numb    Cardiac Risk Stratification Moderate             6 Minute Walk:  6 Minute Walk     Row Name 09/03/23 1519         6 Minute Walk   Phase Initial     Distance 1450 feet     Walk Time 6 minutes     # of Rest Breaks 0     MPH 2.75     METS 4.19     RPE 9     Perceived Dyspnea  0     VO2 Peak 14.66     Symptoms No     Resting HR 76 bpm     Resting BP 112/62     Resting Oxygen Saturation  100 %     Exercise Oxygen Saturation  during 6 min walk 99 %     Max Ex. HR 98 bpm     Max Ex. BP 132/70     2 Minute Post BP 120/66              Oxygen Initial Assessment:   Oxygen Re-Evaluation:   Oxygen Discharge (Final Oxygen Re-Evaluation):   Initial Exercise Prescription:  Initial Exercise Prescription - 09/03/23 1500       Date of Initial Exercise RX and Referring Provider   Date 09/03/23    Referring Provider Dr. Marcina Millard, MD      Oxygen   Maintain Oxygen Saturation 88% or higher      Treadmill    MPH 2.8    Grade 1    Minutes 15    METs 3.53      Elliptical   Level 1    Speed 3    Minutes 15    METs 4.19      REL-XR   Level 4    Speed 50    Minutes 15    METs 4.19      T5 Nustep   Level 3    SPM 80    Minutes 15    METs 4.19      Prescription Details   Frequency (times per week) 3    Duration Progress to 30 minutes of continuous aerobic  without signs/symptoms of physical distress      Intensity   THRR 40-80% of Max Heartrate 112-148    Ratings of Perceived Exertion 11-13    Perceived Dyspnea 0-4      Progression   Progression Continue to progress workloads to maintain intensity without signs/symptoms of physical distress.      Resistance Training   Training Prescription Yes    Weight 7 lb    Reps 10-15             Perform Capillary Blood Glucose checks as needed.  Exercise Prescription Changes:   Exercise Prescription Changes     Row Name 09/03/23 1500 09/13/23 1400           Response to Exercise   Blood Pressure (Admit) 112/62 120/66      Blood Pressure (Exercise) 132/70 136/68      Blood Pressure (Exit) 120/66 112/68      Heart Rate (Admit) 76 bpm 81 bpm      Heart Rate (Exercise) 98 bpm 133 bpm      Heart Rate (Exit) 70 bpm 96 bpm      Oxygen Saturation (Admit) 100 % --      Oxygen Saturation (Exercise) 99 % --      Rating of Perceived Exertion (Exercise) 9 11      Perceived Dyspnea (Exercise) 0 --      Symptoms none none      Comments Results First two days of exercise      Duration -- Continue with 30 min of aerobic exercise without signs/symptoms of physical distress.      Intensity -- THRR unchanged        Progression   Progression -- Continue to progress workloads to maintain intensity without signs/symptoms of physical distress.      Average METs -- 3.23        Resistance Training   Training Prescription -- Yes      Weight -- 7 lb      Reps -- 10-15        Interval Training   Interval Training -- No         Treadmill   MPH -- 3.2      Grade -- 1.5      Minutes -- 15      METs -- 4.11        Elliptical   Level -- 1      Speed -- 3.4      Minutes -- 15      METs -- 4.11        T5 Nustep   Level -- 3      Minutes -- 15      METs -- 2        Oxygen   Maintain Oxygen Saturation -- 88% or higher               Exercise Comments:   Exercise Comments     Row Name 09/04/23 1128           Exercise Comments First full day of exercise!  Patient was oriented to gym and equipment including functions, settings, policies, and procedures.  Patient's individual exercise prescription and treatment plan were reviewed.  All starting workloads were established based on the results of the 6 minute walk test done at initial orientation visit.  The plan for exercise progression was also introduced and progression will be customized based on patient's performance and goals.  Exercise Goals and Review:   Exercise Goals     Row Name 09/03/23 1518             Exercise Goals   Increase Physical Activity Yes       Intervention Provide advice, education, support and counseling about physical activity/exercise needs.;Develop an individualized exercise prescription for aerobic and resistive training based on initial evaluation findings, risk stratification, comorbidities and participant's personal goals.       Expected Outcomes Short Term: Attend rehab on a regular basis to increase amount of physical activity.;Long Term: Add in home exercise to make exercise part of routine and to increase amount of physical activity.;Long Term: Exercising regularly at least 3-5 days a week.       Increase Strength and Stamina Yes       Intervention Provide advice, education, support and counseling about physical activity/exercise needs.;Develop an individualized exercise prescription for aerobic and resistive training based on initial evaluation findings, risk stratification, comorbidities and  participant's personal goals.       Expected Outcomes Short Term: Increase workloads from initial exercise prescription for resistance, speed, and METs.;Short Term: Perform resistance training exercises routinely during rehab and add in resistance training at home;Long Term: Improve cardiorespiratory fitness, muscular endurance and strength as measured by increased METs and functional capacity ( )       Able to understand and use rate of perceived exertion (RPE) scale Yes       Intervention Provide education and explanation on how to use RPE scale       Expected Outcomes Short Term: Able to use RPE daily in rehab to express subjective intensity level;Long Term:  Able to use RPE to guide intensity level when exercising independently       Able to understand and use Dyspnea scale Yes       Intervention Provide education and explanation on how to use Dyspnea scale       Expected Outcomes Short Term: Able to use Dyspnea scale daily in rehab to express subjective sense of shortness of breath during exertion;Long Term: Able to use Dyspnea scale to guide intensity level when exercising independently       Knowledge and understanding of Target Heart Rate Range (THRR) Yes       Intervention Provide education and explanation of THRR including how the numbers were predicted and where they are located for reference       Expected Outcomes Short Term: Able to state/look up THRR;Long Term: Able to use THRR to govern intensity when exercising independently;Short Term: Able to use daily as guideline for intensity in rehab       Able to check pulse independently Yes       Intervention Provide education and demonstration on how to check pulse in carotid and radial arteries.;Review the importance of being able to check your own pulse for safety during independent exercise       Expected Outcomes Short Term: Able to explain why pulse checking is important during independent exercise;Long Term: Able to check pulse  independently and accurately       Understanding of Exercise Prescription Yes       Intervention Provide education, explanation, and written materials on patient's individual exercise prescription       Expected Outcomes Short Term: Able to explain program exercise prescription;Long Term: Able to explain home exercise prescription to exercise independently                Exercise Goals Re-Evaluation :  Exercise Goals Re-Evaluation     Row Name 09/04/23 1127 09/13/23 1415           Exercise Goal Re-Evaluation   Exercise Goals Review Able to understand and use rate of perceived exertion (RPE) scale;Able to understand and use Dyspnea scale;Knowledge and understanding of Target Heart Rate Range (THRR);Understanding of Exercise Prescription Increase Physical Activity;Increase Strength and Stamina;Understanding of Exercise Prescription      Comments Reviewed RPE and dyspnea scale, THR and program prescription with pt today.  Pt voiced understanding and was given a copy of goals to take home. Drue Flirt is off to a good start in the program. She did well on the treadmill during her first two rehab sessions and was able to increase her speed to 3.2 mph with an incline of 1.5%. She also did well at level 1 on the elliptical and level 3 on the T5 nustep. We will continue to monitor her progress in the program.      Expected Outcomes Short: Use RPE daily to regulate intensity. Long: Follow program prescription in THR. Short: Continue to follow current exercise prescription. Long: Continue exercise to improve strength and stamina.               Discharge Exercise Prescription (Final Exercise Prescription Changes):  Exercise Prescription Changes - 09/13/23 1400       Response to Exercise   Blood Pressure (Admit) 120/66    Blood Pressure (Exercise) 136/68    Blood Pressure (Exit) 112/68    Heart Rate (Admit) 81 bpm    Heart Rate (Exercise) 133 bpm    Heart Rate (Exit) 96 bpm    Rating of  Perceived Exertion (Exercise) 11    Symptoms none    Comments First two days of exercise    Duration Continue with 30 min of aerobic exercise without signs/symptoms of physical distress.    Intensity THRR unchanged      Progression   Progression Continue to progress workloads to maintain intensity without signs/symptoms of physical distress.    Average METs 3.23      Resistance Training   Training Prescription Yes    Weight 7 lb    Reps 10-15      Interval Training   Interval Training No      Treadmill   MPH 3.2    Grade 1.5    Minutes 15    METs 4.11      Elliptical   Level 1    Speed 3.4    Minutes 15    METs 4.11      T5 Nustep   Level 3    Minutes 15    METs 2      Oxygen   Maintain Oxygen Saturation 88% or higher             Nutrition:  Target Goals: Understanding of nutrition guidelines, daily intake of sodium 1500mg , cholesterol 200mg , calories 30% from fat and 7% or less from saturated fats, daily to have 5 or more servings of fruits and vegetables.  Education: All About Nutrition: -Group instruction provided by verbal, written material, interactive activities, discussions, models, and posters to present general guidelines for heart healthy nutrition including fat, fiber, MyPlate, the role of sodium in heart healthy nutrition, utilization of the nutrition label, and utilization of this knowledge for meal planning. Follow up email sent as well. Written material given at graduation.   Biometrics:  Pre Biometrics - 09/03/23 1510       Pre  Biometrics   Height 5' 5.25" (1.657 m)    Weight 150 lb 4.8 oz (68.2 kg)    Waist Circumference 31.5 inches    Hip Circumference 39 inches    Waist to Hip Ratio 0.81 %    BMI (Calculated) 24.83    Single Leg Stand 30 seconds              Nutrition Therapy Plan and Nutrition Goals:  Nutrition Therapy & Goals - 09/03/23 1402       Nutrition Therapy   Diet Cardiac    Protein (specify units) 75-90g     Fiber 30 grams    Whole Grain Foods 3 servings    Saturated Fats 15 max. grams    Fruits and Vegetables 5 servings/day    Sodium 2 grams      Personal Nutrition Goals   Nutrition Goal Drink 64oz of water per day    Personal Goal #2 Eat 15-30gProtein and 30-60gCarbs at each meal.    Personal Goal #3 Read labels and reduce sodium intake to below 1500mg  per day.    Comments Patient is drinking ~48oz of water, she reports she drinks more when she is active. Drinks ~3 cans of diet mt dew. Says this is her sweet tooth satisfier. She is on zepbound and has done well with it for many months. She does eat smaller portions, getting full quickly. Spoke with her about prioritizing quality over quantity. Provided handout on small but nutrient dense foods to incorporate more as a way to ensure she meets her nutritional goals while eating smaller more frequent meals. She is a many years member of weight watchers and feels confident with their system but knows she will need to read labels more carefully while she learns to eat heart healthy as well. Provided some target goals for her day with regards to sodium and saturated fat. Walked through a food label of some of her common foods breaking down how to evaluate how heart healthy they are. Provided mediterranean diet handout and answered questions around heart healthy eating.      Intervention Plan   Intervention Prescribe, educate and counsel regarding individualized specific dietary modifications aiming towards targeted core components such as weight, hypertension, lipid management, diabetes, heart failure and other comorbidities.;Nutrition handout(s) given to patient.    Expected Outcomes Short Term Goal: Understand basic principles of dietary content, such as calories, fat, sodium, cholesterol and nutrients.;Short Term Goal: A plan has been developed with personal nutrition goals set during dietitian appointment.;Long Term Goal: Adherence to prescribed nutrition  plan.             Nutrition Assessments:  MEDIFICTS Score Key: >=70 Need to make dietary changes  40-70 Heart Healthy Diet <= 40 Therapeutic Level Cholesterol Diet  Flowsheet Row Cardiac Rehab from 09/03/2023 in [facility] Cardiac and Pulmonary Rehab  Picture Your Plate Total Score on Admission 68      Picture Your Plate Scores: <16 Unhealthy dietary pattern with much room for improvement. 41-50 Dietary pattern unlikely to meet recommendations for good health and room for improvement. 51-60 More healthful dietary pattern, with some room for improvement.  >60 Healthy dietary pattern, although there may be some specific behaviors that could be improved.    Nutrition Goals Re-Evaluation:   Nutrition Goals Discharge (Final Nutrition Goals Re-Evaluation):   Psychosocial: Target Goals: Acknowledge presence or absence of significant depression and/or stress, maximize coping skills, provide positive support system. Participant is able to verbalize types and ability to  use techniques and skills needed for reducing stress and depression.   Education: Stress, Anxiety, and Depression - Group verbal and visual presentation to define topics covered.  Reviews how body is impacted by stress, anxiety, and depression.  Also discusses healthy ways to reduce stress and to treat/manage anxiety and depression.  Written material given at graduation.   Education: Sleep Hygiene -Provides group verbal and written instruction about how sleep can affect your health.  Define sleep hygiene, discuss sleep cycles and impact of sleep habits. Review good sleep hygiene tips.    Initial Review & Psychosocial Screening:  Initial Psych Review & Screening - 08/29/23 1043       Initial Review   Current issues with Current Stress Concerns      Family Dynamics   Good Support System? Yes   husband and children, friends     Barriers   Psychosocial barriers to participate in program There are no identifiable  barriers or psychosocial needs.;The patient should benefit from training in stress management and relaxation.      Screening Interventions   Interventions Encouraged to exercise;Provide feedback about the scores to participant;To provide support and resources with identified psychosocial needs    Expected Outcomes Short Term goal: Utilizing psychosocial counselor, staff and physician to assist with identification of specific Stressors or current issues interfering with healing process. Setting desired goal for each stressor or current issue identified.;Long Term Goal: Stressors or current issues are controlled or eliminated.;Short Term goal: Identification and review with participant of any Quality of Life or Depression concerns found by scoring the questionnaire.;Long Term goal: The participant improves quality of Life and PHQ9 Scores as seen by post scores and/or verbalization of changes             Quality of Life Scores:   Quality of Life - 09/03/23 1030       Quality of Life   Select Quality of Life      Quality of Life Scores   Health/Function Pre 24.03 %    Socioeconomic Pre 21.56 %    Psych/Spiritual Pre 24.71 %    Family Pre 27.1 %    GLOBAL Pre 24.04 %            Scores of 19 and below usually indicate a poorer quality of life in these areas.  A difference of  2-3 points is a clinically meaningful difference.  A difference of 2-3 points in the total score of the Quality of Life Index has been associated with significant improvement in overall quality of life, self-image, physical symptoms, and general health in studies assessing change in quality of life.  PHQ-9: Review Flowsheet       09/03/2023  Depression screen PHQ 2/9  Decreased Interest 0  Down, Depressed, Hopeless 0  PHQ - 2 Score 0  Altered sleeping 0  Tired, decreased energy 1  Change in appetite 0  Feeling bad or failure about yourself  0  Trouble concentrating 0  Moving slowly or fidgety/restless 0   Suicidal thoughts 0  PHQ-9 Score 1  Difficult doing work/chores Not difficult at all   Interpretation of Total Score  Total Score Depression Severity:  1-4 = Minimal depression, 5-9 = Mild depression, 10-14 = Moderate depression, 15-19 = Moderately severe depression, 20-27 = Severe depression   Psychosocial Evaluation and Intervention:  Psychosocial Evaluation - 08/29/23 1049       Psychosocial Evaluation & Interventions   Interventions Encouraged to exercise with the program and follow  exercise prescription;Stress management education;Relaxation education    Comments Drue Flirt is coming to cardiac rehab after stent placement. She has been exercising regularly prior to the stent and wants to get back to feeling confident exercising herself. She states initially her stress level was high after being discharged because she was hesistant to do her normal things in case she hurt her heart and felt that her "normal life" has been changed forever. Her stress level has decreased with each day, but she is looking forward to the education and monitoring in the program. She works from home and is thankful to be back to her typical work schedule. Her husband, children, and friends are a great support system.    Expected Outcomes Short: attend cardiac rehab for education and exercise. Long: develop and maintain positive self care habits    Continue Psychosocial Services  Follow up required by staff             Psychosocial Re-Evaluation:  Psychosocial Re-Evaluation     Row Name 09/18/23 1330             Psychosocial Re-Evaluation   Current issues with Current Stress Concerns       Comments Candy reports her stress level has decreased a lot since starting the program. She feels more confident in exercising now that she has been monitored and given education on the matter. Her work is going well and home life is stable. She has been exercising on her off days from the program and is looking at  possibly graduating early. She is branching out and doing things she was doing prior to her heart event       Expected Outcomes Short: attend cardiac rehab to improve confidence and knowledge. Long: graduate from program                Psychosocial Discharge (Final Psychosocial Re-Evaluation):  Psychosocial Re-Evaluation - 09/18/23 1330       Psychosocial Re-Evaluation   Current issues with Current Stress Concerns    Comments Candy reports her stress level has decreased a lot since starting the program. She feels more confident in exercising now that she has been monitored and given education on the matter. Her work is going well and home life is stable. She has been exercising on her off days from the program and is looking at possibly graduating early. She is branching out and doing things she was doing prior to her heart event    Expected Outcomes Short: attend cardiac rehab to improve confidence and knowledge. Long: graduate from program             Vocational Rehabilitation: Provide vocational rehab assistance to qualifying candidates.   Vocational Rehab Evaluation & Intervention:  Vocational Rehab - 08/29/23 1043       Initial Vocational Rehab Evaluation & Intervention   Assessment shows need for Vocational Rehabilitation No             Education: Education Goals: Education classes will be provided on a variety of topics geared toward better understanding of heart health and risk factor modification. Participant will state understanding/return demonstration of topics presented as noted by education test scores.  Learning Barriers/Preferences:  Learning Barriers/Preferences - 08/29/23 1042       Learning Barriers/Preferences   Learning Barriers None    Learning Preferences None             General Cardiac Education Topics:  AED/CPR: - Group verbal and written instruction with the use  of models to demonstrate the basic use of the AED with the basic  ABC's of resuscitation.   Anatomy and Cardiac Procedures: - Group verbal and visual presentation and models provide information about basic cardiac anatomy and function. Reviews the testing methods done to diagnose heart disease and the outcomes of the test results. Describes the treatment choices: Medical Management, Angioplasty, or Coronary Bypass Surgery for treating various heart conditions including Myocardial Infarction, Angina, Valve Disease, and Cardiac Arrhythmias.  Written material given at graduation.   Medication Safety: - Group verbal and visual instruction to review commonly prescribed medications for heart and lung disease. Reviews the medication, class of the drug, and side effects. Includes the steps to properly store meds and maintain the prescription regimen.  Written material given at graduation.   Intimacy: - Group verbal instruction through game format to discuss how heart and lung disease can affect sexual intimacy. Written material given at graduation..   Know Your Numbers and Heart Failure: - Group verbal and visual instruction to discuss disease risk factors for cardiac and pulmonary disease and treatment options.  Reviews associated critical values for Overweight/Obesity, Hypertension, Cholesterol, and Diabetes.  Discusses basics of heart failure: signs/symptoms and treatments.  Introduces Heart Failure Zone chart for action plan for heart failure.  Written material given at graduation. Flowsheet Row Cardiac Rehab from 09/03/2023 in [facility] Cardiac and Pulmonary Rehab  Education need identified 09/03/23       Infection Prevention: - Provides verbal and written material to individual with discussion of infection control including proper hand washing and proper equipment cleaning during exercise session. Flowsheet Row Cardiac Rehab from 09/03/2023 in [facility] Cardiac and Pulmonary Rehab  Date 09/03/23  Educator NT  Instruction Review Code 1- Verbalizes Understanding        Falls Prevention: - Provides verbal and written material to individual with discussion of falls prevention and safety. Flowsheet Row Cardiac Rehab from 09/03/2023 in [facility] Cardiac and Pulmonary Rehab  Date 09/03/23  Educator NT  Instruction Review Code 1- Verbalizes Understanding       Other: -Provides group and verbal instruction on various topics (see comments)   Knowledge Questionnaire Score:  Knowledge Questionnaire Score - 09/03/23 1030       Knowledge Questionnaire Score   Pre Score 23/26             Core Components/Risk Factors/Patient Goals at Admission:  Personal Goals and Risk Factors at Admission - 08/29/23 1041       Core Components/Risk Factors/Patient Goals on Admission   Hypertension Yes    Intervention Provide education on lifestyle modifcations including regular physical activity/exercise, weight management, moderate sodium restriction and increased consumption of fresh fruit, vegetables, and low fat dairy, alcohol moderation, and smoking cessation.;Monitor prescription use compliance.    Expected Outcomes Short Term: Continued assessment and intervention until BP is < 140/15mm HG in hypertensive participants. < 130/67mm HG in hypertensive participants with diabetes, heart failure or chronic kidney disease.;Long Term: Maintenance of blood pressure at goal levels.    Lipids Yes    Intervention Provide education and support for participant on nutrition & aerobic/resistive exercise along with prescribed medications to achieve LDL 70mg , HDL >40mg .    Expected Outcomes Short Term: Participant states understanding of desired cholesterol values and is compliant with medications prescribed. Participant is following exercise prescription and nutrition guidelines.;Long Term: Cholesterol controlled with medications as prescribed, with individualized exercise RX and with personalized nutrition plan. Value goals: LDL < 70mg , HDL > 40 mg.  Education:Diabetes - Individual verbal and written instruction to review signs/symptoms of diabetes, desired ranges of glucose level fasting, after meals and with exercise. Acknowledge that pre and post exercise glucose checks will be done for 3 sessions at entry of program.   Core Components/Risk Factors/Patient Goals Review:   Goals and Risk Factor Review     Row Name 09/18/23 1327             Core Components/Risk Factors/Patient Goals Review   Personal Goals Review Hypertension;Lipids       Review Candy reports doing well since starting the program. She had been taking her blood pressure regularly before starting the program and continues to do so. Her cholesterol medicine was new to her and she thinks was initially making her not feel the best, but thinks it is starting to level out. She is gaining more confidence in exercising on her own and might graduate early so she can get back to her usual gym routine       Expected Outcomes Short: attend cardiac rehab for education and risk factor management skills Long: independently manage risk factors                Core Components/Risk Factors/Patient Goals at Discharge (Final Review):   Goals and Risk Factor Review - 09/18/23 1327       Core Components/Risk Factors/Patient Goals Review   Personal Goals Review Hypertension;Lipids    Review Candy reports doing well since starting the program. She had been taking her blood pressure regularly before starting the program and continues to do so. Her cholesterol medicine was new to her and she thinks was initially making her not feel the best, but thinks it is starting to level out. She is gaining more confidence in exercising on her own and might graduate early so she can get back to her usual gym routine    Expected Outcomes Short: attend cardiac rehab for education and risk factor management skills Long: independently manage risk factors             ITP Comments:  ITP  Comments     Row Name 08/29/23 1035 09/03/23 1030 09/04/23 1128 09/19/23 0818     ITP Comments Initial phone call completed. Diagnosis can be found in [facility] 1/9. EP Orientation scheduled for Monday 1/27 at 9am. Completed and gym orientation. Initial ITP created and sent for review to Dr. Bethann Punches, Medical Director. First full day of exercise!  Patient was oriented to gym and equipment including functions, settings, policies, and procedures.  Patient's individual exercise prescription and treatment plan were reviewed.  All starting workloads were established based on the results of the 6 minute walk test done at initial orientation visit.  The plan for exercise progression was also introduced and progression will be customized based on patient's performance and goals. 30 Day review completed. Medical Director ITP review done, changes made as directed, and signed approval by Medical Director. New Patient             Comments: 30 day review

## 2023-09-20 ENCOUNTER — Encounter: Payer: 59 | Admitting: *Deleted

## 2023-09-20 DIAGNOSIS — Z955 Presence of coronary angioplasty implant and graft: Secondary | ICD-10-CM

## 2023-09-20 NOTE — Progress Notes (Signed)
Daily Session Note  Patient Details  Name: Chelsea Greene MRN: [id number] Date of Birth: January 22, 1971 Referring Provider:   Flowsheet Row Cardiac Rehab from 09/03/2023 in [facility] Cardiac and Pulmonary Rehab  Referring Provider Dr. Marcina Millard, MD       Encounter Date: 09/20/2023  Check In:  Session Check In - 09/20/23 1123       Check-In   Supervising physician immediately available to respond to emergencies See telemetry face sheet for immediately available ER MD    Location [facility]-Cardiac & Pulmonary Rehab    Staff Present Cora Collum, RN, BSN, CCRP;Meredith Craven RN,BSN;Noah Tickle, BS, Exercise Physiologist;Joseph Hood RCP,RRT,BSRT    Virtual Visit No    Medication changes reported     No    Fall or balance concerns reported    No    Warm-up and Cool-down Performed on first and last piece of equipment    Resistance Training Performed Yes    VAD Patient? No    PAD/SET Patient? No      Pain Assessment   Currently in Pain? No/denies                Social History   Tobacco Use  Smoking Status Never  Smokeless Tobacco Never    Goals Met:  Independence with exercise equipment Exercise tolerated well No report of concerns or symptoms today  Goals Unmet:  Not Applicable  Comments: Pt able to follow exercise prescription today without complaint.  Will continue to monitor for progression.    Dr. Bethann Punches is Medical Director for [facility] Cardiac Rehabilitation.  Dr. Vida Rigger is Medical Director for [facility] Pulmonary Rehabilitation.

## 2023-09-24 ENCOUNTER — Encounter: Payer: Self-pay | Admitting: *Deleted

## 2023-09-24 ENCOUNTER — Encounter: Payer: 59 | Admitting: *Deleted

## 2023-09-24 VITALS — Ht 65.25 in | Wt 149.7 lb

## 2023-09-24 DIAGNOSIS — Z955 Presence of coronary angioplasty implant and graft: Secondary | ICD-10-CM | POA: Diagnosis not present

## 2023-09-24 NOTE — Progress Notes (Signed)
Daily Session Note  Patient Details  Name: ANABETH CHILCOTT MRN: [id number] Date of Birth: 04-16-1971 Referring Provider:   Flowsheet Row Cardiac Rehab from 09/03/2023 in [facility] Cardiac and Pulmonary Rehab  Referring Provider Dr. Marcina Millard, MD       Encounter Date: 09/24/2023  Check In:  Session Check In - 09/24/23 1554       Check-In   Supervising physician immediately available to respond to emergencies See telemetry face sheet for immediately available ER MD    Location [facility]-Cardiac & Pulmonary Rehab    Staff Present Susann Givens RN,BSN;Joseph [facility] Madilyn Fireman BS, ACSM CEP, Exercise Physiologist;Maxon Conetta BS, Exercise Physiologist    Virtual Visit No    Medication changes reported     No    Fall or balance concerns reported    No    Warm-up and Cool-down Performed on first and last piece of equipment    Resistance Training Performed Yes    VAD Patient? No    PAD/SET Patient? No      Pain Assessment   Currently in Pain? No/denies                Social History   Tobacco Use  Smoking Status Never  Smokeless Tobacco Never    Goals Met:  Independence with exercise equipment Exercise tolerated well No report of concerns or symptoms today Strength training completed today  Goals Unmet:  Not Applicable  Comments: Pt able to follow exercise prescription today without complaint.  Will continue to monitor for progression.   6 Minute Walk     Row Name 09/03/23 1519 09/24/23 1557       6 Minute Walk   Phase Initial Discharge    Distance 1450 feet 1700 feet    Distance % Change -- 17 %    Distance Feet Change -- 250 ft    Walk Time 6 minutes 6 minutes    # of Rest Breaks 0 0    MPH 2.75 3.22    METS 4.19 4.72    RPE 9 11    Perceived Dyspnea  0 0    VO2 Peak 14.66 16.52    Symptoms No No    Resting HR 76 bpm 75 bpm    Resting BP 112/62 124/66    Resting Oxygen Saturation  100 % 98 %    Exercise Oxygen Saturation  during 6  min walk 99 % 99 %    Max Ex. HR 98 bpm 120 bpm    Max Ex. BP 132/70 118/70    2 Minute Post BP 120/66 --               Dr. Bethann Punches is Medical Director for [facility] Cardiac Rehabilitation.  Dr. Vida Rigger is Medical Director for [facility] Pulmonary Rehabilitation.

## 2023-09-24 NOTE — Patient Instructions (Signed)
Discharge Patient Instructions  Patient Details  Name: Chelsea Greene MRN: [id number] Date of Birth: Jul 07, 1971 Referring Provider:  [name], MD   Number of Visits: 12  Reason for Discharge:  Early Exit:  Insurance and Personal  Smoking History:  Social History   Tobacco Use  Smoking Status Never  Smokeless Tobacco Never    Diagnosis:  Status post coronary artery stent placement  Initial Exercise Prescription:  Initial Exercise Prescription - 09/03/23 1500       Date of Initial Exercise RX and Referring Provider   Date 09/03/23    Referring Provider Dr. Marcina Millard, MD      Oxygen   Maintain Oxygen Saturation 88% or higher      Treadmill   MPH 2.8    Grade 1    Minutes 15    METs 3.53      Elliptical   Level 1    Speed 3    Minutes 15    METs 4.19      REL-XR   Level 4    Speed 50    Minutes 15    METs 4.19      T5 Nustep   Level 3    SPM 80    Minutes 15    METs 4.19      Prescription Details   Frequency (times per week) 3    Duration Progress to 30 minutes of continuous aerobic without signs/symptoms of physical distress      Intensity   THRR 40-80% of Max Heartrate 112-148    Ratings of Perceived Exertion 11-13    Perceived Dyspnea 0-4      Progression   Progression Continue to progress workloads to maintain intensity without signs/symptoms of physical distress.      Resistance Training   Training Prescription Yes    Weight 7 lb    Reps 10-15             Discharge Exercise Prescription (Final Exercise Prescription Changes):  Exercise Prescription Changes - 09/20/23 1100       Home Exercise Plan   Plans to continue exercise at [facility] (comment)   Going to the gym [employer], hand weights and aerobic machines   Frequency Add 2 additional days to program exercise sessions.    Initial Home Exercises Provided 09/20/23      Oxygen   Maintain Oxygen Saturation 88% or higher              Functional Capacity:  6 Minute Walk     Row Name 09/03/23 1519 09/24/23 1557       6 Minute Walk   Phase Initial Discharge    Distance 1450 feet 1700 feet    Distance % Change -- 17 %    Distance Feet Change -- 250 ft    Walk Time 6 minutes 6 minutes    # of Rest Breaks 0 0    MPH 2.75 3.22    METS 4.19 4.72    RPE 9 11    Perceived Dyspnea  0 0    VO2 Peak 14.66 16.52    Symptoms No No    Resting HR 76 bpm 75 bpm    Resting BP 112/62 124/66    Resting Oxygen Saturation  100 % 98 %    Exercise Oxygen Saturation  during 6 min walk 99 % 99 %    Max Ex. HR 98 bpm 120 bpm    Max Ex. BP  132/70 118/70    2 Minute Post BP 120/66 --             Nutrition & Weight - Outcomes:  Pre Biometrics - 09/03/23 1510       Pre Biometrics   Height 5' 5.25" (1.657 m)    Weight 150 lb 4.8 oz (68.2 kg)    Waist Circumference 31.5 inches    Hip Circumference 39 inches    Waist to Hip Ratio 0.81 %    BMI (Calculated) 24.83    Single Leg Stand 30 seconds             Post Biometrics - 09/24/23 1559        Post  Biometrics   Height 5' 5.25" (1.657 m)    Weight 149 lb 11.2 oz (67.9 kg)    Waist Circumference 31.5 inches    Hip Circumference 39 inches    Waist to Hip Ratio 0.81 %    BMI (Calculated) 24.73    Single Leg Stand 30 seconds            Nutrition:  Nutrition Therapy & Goals - 09/03/23 1402       Nutrition Therapy   Diet Cardiac    Protein (specify units) 75-90g    Fiber 30 grams    Whole Grain Foods 3 servings    Saturated Fats 15 max. grams    Fruits and Vegetables 5 servings/day    Sodium 2 grams      Personal Nutrition Goals   Nutrition Goal Drink 64oz of water per day    Personal Goal #2 Eat 15-30gProtein and 30-60gCarbs at each meal.    Personal Goal #3 Read labels and reduce sodium intake to below 1500mg  per day.    Comments Patient is drinking ~48oz of water, she reports she drinks more when she is active. Drinks ~3 cans of diet mt dew.  Says this is her sweet tooth satisfier. She is on zepbound and has done well with it for many months. She does eat smaller portions, getting full quickly. Spoke with her about prioritizing quality over quantity. Provided handout on small but nutrient dense foods to incorporate more as a way to ensure she meets her nutritional goals while eating smaller more frequent meals. She is a many years member of weight watchers and feels confident with their system but knows she will need to read labels more carefully while she learns to eat heart healthy as well. Provided some target goals for her day with regards to sodium and saturated fat. Walked through a food label of some of her common foods breaking down how to evaluate how heart healthy they are. Provided mediterranean diet handout and answered questions around heart healthy eating.      Intervention Plan   Intervention Prescribe, educate and counsel regarding individualized specific dietary modifications aiming towards targeted core components such as weight, hypertension, lipid management, diabetes, heart failure and other comorbidities.;Nutrition handout(s) given to patient.    Expected Outcomes Short Term Goal: Understand basic principles of dietary content, such as calories, fat, sodium, cholesterol and nutrients.;Short Term Goal: A plan has been developed with personal nutrition goals set during dietitian appointment.;Long Term Goal: Adherence to prescribed nutrition plan.             Goals reviewed with patient; copy given to patient.

## 2023-09-25 ENCOUNTER — Encounter: Payer: 59 | Admitting: *Deleted

## 2023-09-25 DIAGNOSIS — Z955 Presence of coronary angioplasty implant and graft: Secondary | ICD-10-CM | POA: Diagnosis not present

## 2023-09-25 NOTE — Progress Notes (Signed)
Daily Session Note  Patient Details  Name: Chelsea Greene MRN: [id number] Date of Birth: Oct 29, 1970 Referring Provider:   Flowsheet Row Cardiac Rehab from 09/03/2023 in [facility] Cardiac and Pulmonary Rehab  Referring Provider Dr. Marcina Millard, MD       Encounter Date: 09/25/2023  Check In:  Session Check In - 09/25/23 1130       Check-In   Supervising physician immediately available to respond to emergencies See telemetry face sheet for immediately available ER MD    Location [facility]-Cardiac & Pulmonary Rehab    Staff Present Cora Collum, RN, BSN, CCRP;Meredith Craven RN,BSN;Noah Tickle, BS, Exercise Physiologist;Margaret Best, MS, Exercise Physiologist;Maxon Conetta BS, Exercise Physiologist;Joseph Reino Kent RCP,RRT,BSRT    Virtual Visit No    Medication changes reported     No    Fall or balance concerns reported    No    Warm-up and Cool-down Performed on first and last piece of equipment    Resistance Training Performed Yes    VAD Patient? No    PAD/SET Patient? No      Pain Assessment   Currently in Pain? No/denies                Social History   Tobacco Use  Smoking Status Never  Smokeless Tobacco Never    Goals Met:  Independence with exercise equipment Exercise tolerated well No report of concerns or symptoms today  Goals Unmet:  Not Applicable  Comments:  Chelsea Greene graduated today from  rehab with 10 sessions completed.  Details of the patient's exercise prescription and what She needs to do in order to continue the prescription and progress were discussed with patient.  Patient was given a copy of prescription and goals.  Patient verbalized understanding. Chelsea Greene plans to continue to exercise by exercising at home and gym..    Dr. Bethann Punches is Medical Director for [facility] Cardiac Rehabilitation.  Dr. Vida Rigger is Medical Director for [facility] Pulmonary Rehabilitation.

## 2023-09-25 NOTE — Progress Notes (Signed)
Cardiac Individual Treatment Plan  Patient Details  Name: Chelsea Greene MRN: [id number] Date of Birth: 1970/12/08 Referring Provider:   Flowsheet Row Cardiac Rehab from 09/03/2023 in [facility] Cardiac and Pulmonary Rehab  Referring Provider Dr. Marcina Millard, MD       Initial Encounter Date:  Flowsheet Row Cardiac Rehab from 09/03/2023 in [facility] Cardiac and Pulmonary Rehab  Date 09/03/23       Visit Diagnosis: Status post coronary artery stent placement  Patient's Home Medications on Admission:  Current Outpatient Medications:    aspirin 81 MG chewable tablet, Chew 1 tablet (81 mg total) by mouth daily., Disp: 90 tablet, Rfl: 3   estradiol (ESTRACE) 0.5 MG tablet, Take 1 tablet by mouth daily., Disp: , Rfl:    Fe Fum-Vit C-Vit B12-FA (TRIGELS-F FORTE) CAPS capsule, Take 1 capsule by mouth daily after breakfast., Disp: 90 capsule, Rfl: 0   hydrocortisone (PROCTOSOL HC) 2.5 % rectal cream, Proctosol HC 2.5 % topical cream perineal applicator  APPLY SPARINGLY TO AFFECTED AREA 2 TO 4 TIMES A DAY, Disp: , Rfl:    levothyroxine (SYNTHROID) 88 MCG tablet, Take 88 mcg by mouth daily before breakfast., Disp: , Rfl:    metoprolol succinate (TOPROL-XL) 25 MG 24 hr tablet, Take 0.5 tablets (12.5 mg total) by mouth daily., Disp: 30 tablet, Rfl: 2   MIMVEY 1-0.5 MG tablet, TAKE 1 TABLET(S) EVERY DAY BY ORAL ROUTE., Disp: , Rfl: 12   prasugrel (EFFIENT) 10 MG TABS tablet, Take 1 tablet (10 mg total) by mouth daily., Disp: 90 tablet, Rfl: 3   rosuvastatin (CRESTOR) 40 MG tablet, Take 1 tablet (40 mg total) by mouth daily., Disp: 90 tablet, Rfl: 2   valACYclovir (VALTREX) 500 MG tablet, Take 500 mg by mouth 2 (two) times daily as needed., Disp: , Rfl:    ZEPBOUND 12.5 MG/0.5ML Pen, Inject 12.5 mg into the skin once a week., Disp: , Rfl:   Past Medical History: Past Medical History:  Diagnosis Date   Anxiety    Hypertension    Medical history non-contributory    Varicose veins of both lower  extremities     Tobacco Use: Social History   Tobacco Use  Smoking Status Never  Smokeless Tobacco Never    Labs: Review Flowsheet       Latest Ref Rng & Units 08/15/2023  Labs for ITP Cardiac and Pulmonary Rehab  Cholestrol 0 - 200 mg/dL 409   LDL (calc) 0 - 99 mg/dL 811   HDL-C >91 mg/dL 71   Trlycerides <478 mg/dL 54   Hemoglobin G9F 4.8 - 5.6 % 5.2      Exercise Target Goals: Exercise Program Goal: Individual exercise prescription set using results from initial 6 min walk test and THRR while considering  patient's activity barriers and safety.   Exercise Prescription Goal: Initial exercise prescription builds to 30-45 minutes a day of aerobic activity, 2-3 days per week.  Home exercise guidelines will be given to patient during program as part of exercise prescription that the participant will acknowledge.   Education: Aerobic Exercise: - Group verbal and visual presentation on the components of exercise prescription. Introduces F.I.T.T principle from ACSM for exercise prescriptions.  Reviews F.I.T.T. principles of aerobic exercise including progression. Written material given at graduation.   Education: Resistance Exercise: - Group verbal and visual presentation on the components of exercise prescription. Introduces F.I.T.T principle from ACSM for exercise prescriptions  Reviews F.I.T.T. principles of resistance exercise including progression. Written material given at graduation.  Education: Exercise & Equipment Safety: - Individual verbal instruction and demonstration of equipment use and safety with use of the equipment. Flowsheet Row Cardiac Rehab from 09/03/2023 in [facility] Cardiac and Pulmonary Rehab  Date 09/03/23  Educator NT  Instruction Review Code 1- Verbalizes Understanding       Education: Exercise Physiology & General Exercise Guidelines: - Group verbal and written instruction with models to review the exercise physiology of the cardiovascular system  and associated critical values. Provides general exercise guidelines with specific guidelines to those with heart or lung disease.    Education: Flexibility, Balance, Mind/Body Relaxation: - Group verbal and visual presentation with interactive activity on the components of exercise prescription. Introduces F.I.T.T principle from ACSM for exercise prescriptions. Reviews F.I.T.T. principles of flexibility and balance exercise training including progression. Also discusses the mind body connection.  Reviews various relaxation techniques to help reduce and manage stress (i.e. Deep breathing, progressive muscle relaxation, and visualization). Balance handout provided to take home. Written material given at graduation.   Activity Barriers & Risk Stratification:  Activity Barriers & Cardiac Risk Stratification - 08/29/23 1037       Activity Barriers & Cardiac Risk Stratification   Activity Barriers Other (comment);Neck/Spine Problems    Comments left leg numb    Cardiac Risk Stratification Moderate             6 Minute Walk:  6 Minute Walk     Row Name 09/03/23 1519 09/24/23 1557       6 Minute Walk   Phase Initial Discharge    Distance 1450 feet 1700 feet    Distance % Change -- 17 %    Distance Feet Change -- 250 ft    Walk Time 6 minutes 6 minutes    # of Rest Breaks 0 0    MPH 2.75 3.22    METS 4.19 4.72    RPE 9 11    Perceived Dyspnea  0 0    VO2 Peak 14.66 16.52    Symptoms No No    Resting HR 76 bpm 75 bpm    Resting BP 112/62 124/66    Resting Oxygen Saturation  100 % 98 %    Exercise Oxygen Saturation  during 6 min walk 99 % 99 %    Max Ex. HR 98 bpm 120 bpm    Max Ex. BP 132/70 118/70    2 Minute Post BP 120/66 --             Oxygen Initial Assessment:   Oxygen Re-Evaluation:   Oxygen Discharge (Final Oxygen Re-Evaluation):   Initial Exercise Prescription:  Initial Exercise Prescription - 09/03/23 1500       Date of Initial Exercise RX and  Referring Provider   Date 09/03/23    Referring Provider Dr. Marcina Millard, MD      Oxygen   Maintain Oxygen Saturation 88% or higher      Treadmill   MPH 2.8    Grade 1    Minutes 15    METs 3.53      Elliptical   Level 1    Speed 3    Minutes 15    METs 4.19      REL-XR   Level 4    Speed 50    Minutes 15    METs 4.19      T5 Nustep   Level 3    SPM 80    Minutes 15    METs 4.19  Prescription Details   Frequency (times per week) 3    Duration Progress to 30 minutes of continuous aerobic without signs/symptoms of physical distress      Intensity   THRR 40-80% of Max Heartrate 112-148    Ratings of Perceived Exertion 11-13    Perceived Dyspnea 0-4      Progression   Progression Continue to progress workloads to maintain intensity without signs/symptoms of physical distress.      Resistance Training   Training Prescription Yes    Weight 7 lb    Reps 10-15             Perform Capillary Blood Glucose checks as needed.  Exercise Prescription Changes:   Exercise Prescription Changes     Row Name 09/03/23 1500 09/13/23 1400 09/20/23 1100         Response to Exercise   Blood Pressure (Admit) 112/62 120/66 --     Blood Pressure (Exercise) 132/70 136/68 --     Blood Pressure (Exit) 120/66 112/68 --     Heart Rate (Admit) 76 bpm 81 bpm --     Heart Rate (Exercise) 98 bpm 133 bpm --     Heart Rate (Exit) 70 bpm 96 bpm --     Oxygen Saturation (Admit) 100 % -- --     Oxygen Saturation (Exercise) 99 % -- --     Rating of Perceived Exertion (Exercise) 9 11 --     Perceived Dyspnea (Exercise) 0 -- --     Symptoms none none --     Comments Results First two days of exercise --     Duration -- Continue with 30 min of aerobic exercise without signs/symptoms of physical distress. --     Intensity -- THRR unchanged --       Progression   Progression -- Continue to progress workloads to maintain intensity without signs/symptoms of physical  distress. --     Average METs -- 3.23 --       Resistance Training   Training Prescription -- Yes --     Weight -- 7 lb --     Reps -- 10-15 --       Interval Training   Interval Training -- No --       Treadmill   MPH -- 3.2 --     Grade -- 1.5 --     Minutes -- 15 --     METs -- 4.11 --       Elliptical   Level -- 1 --     Speed -- 3.4 --     Minutes -- 15 --     METs -- 4.11 --       T5 Nustep   Level -- 3 --     Minutes -- 15 --     METs -- 2 --       Home Exercise Plan   Plans to continue exercise at -- -- [employer] (comment)  Going to the gym [employer], hand weights and aerobic machines     Frequency -- -- Add 2 additional days to program exercise sessions.     Initial Home Exercises Provided -- -- 09/20/23       Oxygen   Maintain Oxygen Saturation -- 88% or higher 88% or higher              Exercise Comments:   Exercise Comments     Row Name 09/04/23 1128 09/25/23 1131  Exercise Comments First full day of exercise!  Patient was oriented to gym and equipment including functions, settings, policies, and procedures.  Patient's individual exercise prescription and treatment plan were reviewed.  All starting workloads were established based on the results of the 6 minute walk test done at initial orientation visit.  The plan for exercise progression was also introduced and progression will be customized based on patient's performance and goals. Chelsea Greene graduated today from  rehab with 10 sessions completed.  Details of the patient's exercise prescription and what Chelsea Greene needs to do in order to continue the prescription and progress were discussed with patient.  Patient was given a copy of prescription and goals.  Patient verbalized understanding. Chelsea Greene plans to continue to exercise by exercising at home and gym.Marland Kitchen               Exercise Goals and Review:   Exercise Goals     Row Name 09/03/23 1518             Exercise  Goals   Increase Physical Activity Yes       Intervention Provide advice, education, support and counseling about physical activity/exercise needs.;Develop an individualized exercise prescription for aerobic and resistive training based on initial evaluation findings, risk stratification, comorbidities and participant's personal goals.       Expected Outcomes Short Term: Attend rehab on a regular basis to increase amount of physical activity.;Long Term: Add in home exercise to make exercise part of routine and to increase amount of physical activity.;Long Term: Exercising regularly at least 3-5 days a week.       Increase Strength and Stamina Yes       Intervention Provide advice, education, support and counseling about physical activity/exercise needs.;Develop an individualized exercise prescription for aerobic and resistive training based on initial evaluation findings, risk stratification, comorbidities and participant's personal goals.       Expected Outcomes Short Term: Increase workloads from initial exercise prescription for resistance, speed, and METs.;Short Term: Perform resistance training exercises routinely during rehab and add in resistance training at home;Long Term: Improve cardiorespiratory fitness, muscular endurance and strength as measured by increased METs and functional capacity ( )       Able to understand and use rate of perceived exertion (RPE) scale Yes       Intervention Provide education and explanation on how to use RPE scale       Expected Outcomes Short Term: Able to use RPE daily in rehab to express subjective intensity level;Long Term:  Able to use RPE to guide intensity level when exercising independently       Able to understand and use Dyspnea scale Yes       Intervention Provide education and explanation on how to use Dyspnea scale       Expected Outcomes Short Term: Able to use Dyspnea scale daily in rehab to express subjective sense of shortness of breath during  exertion;Long Term: Able to use Dyspnea scale to guide intensity level when exercising independently       Knowledge and understanding of Target Heart Rate Range (THRR) Yes       Intervention Provide education and explanation of THRR including how the numbers were predicted and where they are located for reference       Expected Outcomes Short Term: Able to state/look up THRR;Long Term: Able to use THRR to govern intensity when exercising independently;Short Term: Able to use daily as guideline for intensity in rehab  Able to check pulse independently Yes       Intervention Provide education and demonstration on how to check pulse in carotid and radial arteries.;Review the importance of being able to check your own pulse for safety during independent exercise       Expected Outcomes Short Term: Able to explain why pulse checking is important during independent exercise;Long Term: Able to check pulse independently and accurately       Understanding of Exercise Prescription Yes       Intervention Provide education, explanation, and written materials on patient's individual exercise prescription       Expected Outcomes Short Term: Able to explain program exercise prescription;Long Term: Able to explain home exercise prescription to exercise independently                Exercise Goals Re-Evaluation :  Exercise Goals Re-Evaluation     Row Name 09/04/23 1127 09/13/23 1415 09/20/23 1141         Exercise Goal Re-Evaluation   Exercise Goals Review Able to understand and use rate of perceived exertion (RPE) scale;Able to understand and use Dyspnea scale;Knowledge and understanding of Target Heart Rate Range (THRR);Understanding of Exercise Prescription Increase Physical Activity;Increase Strength and Stamina;Understanding of Exercise Prescription Increase Physical Activity;Increase Strength and Stamina;Understanding of Exercise Prescription;Able to understand and use Dyspnea scale;Knowledge and  understanding of Target Heart Rate Range (THRR);Able to check pulse independently;Able to understand and use rate of perceived exertion (RPE) scale     Comments Reviewed RPE and dyspnea scale, THR and program prescription with pt today.  Pt voiced understanding and was given a copy of goals to take home. Chelsea Greene is off to a good start in the program. Chelsea Greene did well on the treadmill during her first two rehab sessions and was able to increase her speed to 3.2 mph with an incline of 1.5%. Chelsea Greene also did well at level 1 on the elliptical and level 3 on the T5 nustep. We will continue to monitor her progress in the program. Reviewed home exercise with pt today.  Pt plans to go to the [employer] and use their handweights for resistance training. Chelsea Greene also will walk two miles on the treadmill and use other aerobic machines for aerobic exercise.  Reviewed THR, pulse, RPE, sign and symptoms, pulse oximetery and when to call 911 or MD.  Also discussed weather considerations and indoor options.  Pt voiced understanding.     Expected Outcomes Short: Use RPE daily to regulate intensity. Long: Follow program prescription in THR. Short: Continue to follow current exercise prescription. Long: Continue exercise to improve strength and stamina. Short: Continue going to the gym on days away from rehab. Long: Continue to exercise independently.              Discharge Exercise Prescription (Final Exercise Prescription Changes):  Exercise Prescription Changes - 09/20/23 1100       Home Exercise Plan   Plans to continue exercise at [facility] (comment)   Going to the gym [employer], hand weights and aerobic machines   Frequency Add 2 additional days to program exercise sessions.    Initial Home Exercises Provided 09/20/23      Oxygen   Maintain Oxygen Saturation 88% or higher             Nutrition:  Target Goals: Understanding of nutrition guidelines, daily intake of sodium  1500mg , cholesterol 200mg , calories 30% from fat and 7% or less from saturated fats, daily  to have 5 or more servings of fruits and vegetables.  Education: All About Nutrition: -Group instruction provided by verbal, written material, interactive activities, discussions, models, and posters to present general guidelines for heart healthy nutrition including fat, fiber, MyPlate, the role of sodium in heart healthy nutrition, utilization of the nutrition label, and utilization of this knowledge for meal planning. Follow up email sent as well. Written material given at graduation.   Biometrics:  Pre Biometrics - 09/03/23 1510       Pre Biometrics   Height 5' 5.25" (1.657 m)    Weight 150 lb 4.8 oz (68.2 kg)    Waist Circumference 31.5 inches    Hip Circumference 39 inches    Waist to Hip Ratio 0.81 %    BMI (Calculated) 24.83    Single Leg Stand 30 seconds             Post Biometrics - 09/24/23 1559        Post  Biometrics   Height 5' 5.25" (1.657 m)    Weight 149 lb 11.2 oz (67.9 kg)    Waist Circumference 31.5 inches    Hip Circumference 39 inches    Waist to Hip Ratio 0.81 %    BMI (Calculated) 24.73    Single Leg Stand 30 seconds             Nutrition Therapy Plan and Nutrition Goals:  Nutrition Therapy & Goals - 09/03/23 1402       Nutrition Therapy   Diet Cardiac    Protein (specify units) 75-90g    Fiber 30 grams    Whole Grain Foods 3 servings    Saturated Fats 15 max. grams    Fruits and Vegetables 5 servings/day    Sodium 2 grams      Personal Nutrition Goals   Nutrition Goal Drink 64oz of water per day    Personal Goal #2 Eat 15-30gProtein and 30-60gCarbs at each meal.    Personal Goal #3 Read labels and reduce sodium intake to below 1500mg  per day.    Comments Patient is drinking ~48oz of water, Chelsea Greene reports Chelsea Greene drinks more when Chelsea Greene is active. Drinks ~3 cans of diet mt dew. Says this is her sweet tooth satisfier. Chelsea Greene is on zepbound and has done well  with it for many months. Chelsea Greene does eat smaller portions, getting full quickly. Spoke with her about prioritizing quality over quantity. Provided handout on small but nutrient dense foods to incorporate more as a way to ensure Chelsea Greene meets her nutritional goals while eating smaller more frequent meals. Chelsea Greene is a many years member of weight watchers and feels confident with their system but knows Chelsea Greene will need to read labels more carefully while Chelsea Greene learns to eat heart healthy as well. Provided some target goals for her day with regards to sodium and saturated fat. Walked through a food label of some of her common foods breaking down how to evaluate how heart healthy they are. Provided mediterranean diet handout and answered questions around heart healthy eating.      Intervention Plan   Intervention Prescribe, educate and counsel regarding individualized specific dietary modifications aiming towards targeted core components such as weight, hypertension, lipid management, diabetes, heart failure and other comorbidities.;Nutrition handout(s) given to patient.    Expected Outcomes Short Term Goal: Understand basic principles of dietary content, such as calories, fat, sodium, cholesterol and nutrients.;Short Term Goal: A plan has been developed with personal nutrition goals set during dietitian appointment.;Long  Term Goal: Adherence to prescribed nutrition plan.             Nutrition Assessments:  MEDIFICTS Score Key: >=70 Need to make dietary changes  40-70 Heart Healthy Diet <= 40 Therapeutic Level Cholesterol Diet  Flowsheet Row Cardiac Rehab from 09/03/2023 in [facility] Cardiac and Pulmonary Rehab  Picture Your Plate Total Score on Admission 68      Picture Your Plate Scores: <40 Unhealthy dietary pattern with much room for improvement. 41-50 Dietary pattern unlikely to meet recommendations for good health and room for improvement. 51-60 More healthful dietary pattern, with some room for improvement.   >60 Healthy dietary pattern, although there may be some specific behaviors that could be improved.    Nutrition Goals Re-Evaluation:   Nutrition Goals Discharge (Final Nutrition Goals Re-Evaluation):   Psychosocial: Target Goals: Acknowledge presence or absence of significant depression and/or stress, maximize coping skills, provide positive support system. Participant is able to verbalize types and ability to use techniques and skills needed for reducing stress and depression.   Education: Stress, Anxiety, and Depression - Group verbal and visual presentation to define topics covered.  Reviews how body is impacted by stress, anxiety, and depression.  Also discusses healthy ways to reduce stress and to treat/manage anxiety and depression.  Written material given at graduation.   Education: Sleep Hygiene -Provides group verbal and written instruction about how sleep can affect your health.  Define sleep hygiene, discuss sleep cycles and impact of sleep habits. Review good sleep hygiene tips.    Initial Review & Psychosocial Screening:  Initial Psych Review & Screening - 08/29/23 1043       Initial Review   Current issues with Current Stress Concerns      Family Dynamics   Good Support System? Yes   husband and children, friends     Barriers   Psychosocial barriers to participate in program There are no identifiable barriers or psychosocial needs.;The patient should benefit from training in stress management and relaxation.      Screening Interventions   Interventions Encouraged to exercise;Provide feedback about the scores to participant;To provide support and resources with identified psychosocial needs    Expected Outcomes Short Term goal: Utilizing psychosocial counselor, staff and physician to assist with identification of specific Stressors or current issues interfering with healing process. Setting desired goal for each stressor or current issue identified.;Long Term Goal:  Stressors or current issues are controlled or eliminated.;Short Term goal: Identification and review with participant of any Quality of Life or Depression concerns found by scoring the questionnaire.;Long Term goal: The participant improves quality of Life and PHQ9 Scores as seen by post scores and/or verbalization of changes             Quality of Life Scores:   Quality of Life - 09/03/23 1030       Quality of Life   Select Quality of Life      Quality of Life Scores   Health/Function Pre 24.03 %    Socioeconomic Pre 21.56 %    Psych/Spiritual Pre 24.71 %    Family Pre 27.1 %    GLOBAL Pre 24.04 %            Scores of 19 and below usually indicate a poorer quality of life in these areas.  A difference of  2-3 points is a clinically meaningful difference.  A difference of 2-3 points in the total score of the Quality of Life Index has been associated with  significant improvement in overall quality of life, self-image, physical symptoms, and general health in studies assessing change in quality of life.  PHQ-9: Review Flowsheet       09/03/2023  Depression screen PHQ 2/9  Decreased Interest 0  Down, Depressed, Hopeless 0  PHQ - 2 Score 0  Altered sleeping 0  Tired, decreased energy 1  Change in appetite 0  Feeling bad or failure about yourself  0  Trouble concentrating 0  Moving slowly or fidgety/restless 0  Suicidal thoughts 0  PHQ-9 Score 1  Difficult doing work/chores Not difficult at all   Interpretation of Total Score  Total Score Depression Severity:  1-4 = Minimal depression, 5-9 = Mild depression, 10-14 = Moderate depression, 15-19 = Moderately severe depression, 20-27 = Severe depression   Psychosocial Evaluation and Intervention:  Psychosocial Evaluation - 08/29/23 1049       Psychosocial Evaluation & Interventions   Interventions Encouraged to exercise with the program and follow exercise prescription;Stress management education;Relaxation education     Comments Chelsea Greene is coming to cardiac rehab after stent placement. Chelsea Greene has been exercising regularly prior to the stent and wants to get back to feeling confident exercising herself. Chelsea Greene states initially her stress level was high after being discharged because Chelsea Greene was hesistant to do her normal things in case Chelsea Greene hurt her heart and felt that her "normal life" has been changed forever. Her stress level has decreased with each day, but Chelsea Greene is looking forward to the education and monitoring in the program. Chelsea Greene works from home and is thankful to be back to her typical work schedule. Her husband, children, and friends are a great support system.    Expected Outcomes Short: attend cardiac rehab for education and exercise. Long: develop and maintain positive self care habits    Continue Psychosocial Services  Follow up required by staff             Psychosocial Re-Evaluation:  Psychosocial Re-Evaluation     Row Name 09/18/23 1330             Psychosocial Re-Evaluation   Current issues with Current Stress Concerns       Comments Chelsea Greene reports her stress level has decreased a lot since starting the program. Chelsea Greene feels more confident in exercising now that Chelsea Greene has been monitored and given education on the matter. Her work is going well and home life is stable. Chelsea Greene has been exercising on her off days from the program and is looking at possibly graduating early. Chelsea Greene is branching out and doing things Chelsea Greene was doing prior to her heart event       Expected Outcomes Short: attend cardiac rehab to improve confidence and knowledge. Long: graduate from program                Psychosocial Discharge (Final Psychosocial Re-Evaluation):  Psychosocial Re-Evaluation - 09/18/23 1330       Psychosocial Re-Evaluation   Current issues with Current Stress Concerns    Comments Chelsea Greene reports her stress level has decreased a lot since starting the program. Chelsea Greene feels more confident in exercising now that Chelsea Greene has  been monitored and given education on the matter. Her work is going well and home life is stable. Chelsea Greene has been exercising on her off days from the program and is looking at possibly graduating early. Chelsea Greene is branching out and doing things Chelsea Greene was doing prior to her heart event    Expected Outcomes Short: attend cardiac rehab to  improve confidence and knowledge. Long: graduate from program             Vocational Rehabilitation: Provide vocational rehab assistance to qualifying candidates.   Vocational Rehab Evaluation & Intervention:  Vocational Rehab - 08/29/23 1043       Initial Vocational Rehab Evaluation & Intervention   Assessment shows need for Vocational Rehabilitation No             Education: Education Goals: Education classes will be provided on a variety of topics geared toward better understanding of heart health and risk factor modification. Participant will state understanding/return demonstration of topics presented as noted by education test scores.  Learning Barriers/Preferences:  Learning Barriers/Preferences - 08/29/23 1042       Learning Barriers/Preferences   Learning Barriers None    Learning Preferences None             General Cardiac Education Topics:  AED/CPR: - Group verbal and written instruction with the use of models to demonstrate the basic use of the AED with the basic ABC's of resuscitation.   Anatomy and Cardiac Procedures: - Group verbal and visual presentation and models provide information about basic cardiac anatomy and function. Reviews the testing methods done to diagnose heart disease and the outcomes of the test results. Describes the treatment choices: Medical Management, Angioplasty, or Coronary Bypass Surgery for treating various heart conditions including Myocardial Infarction, Angina, Valve Disease, and Cardiac Arrhythmias.  Written material given at graduation.   Medication Safety: - Group verbal and visual instruction  to review commonly prescribed medications for heart and lung disease. Reviews the medication, class of the drug, and side effects. Includes the steps to properly store meds and maintain the prescription regimen.  Written material given at graduation.   Intimacy: - Group verbal instruction through game format to discuss how heart and lung disease can affect sexual intimacy. Written material given at graduation..   Know Your Numbers and Heart Failure: - Group verbal and visual instruction to discuss disease risk factors for cardiac and pulmonary disease and treatment options.  Reviews associated critical values for Overweight/Obesity, Hypertension, Cholesterol, and Diabetes.  Discusses basics of heart failure: signs/symptoms and treatments.  Introduces Heart Failure Zone chart for action plan for heart failure.  Written material given at graduation. Flowsheet Row Cardiac Rehab from 09/03/2023 in [facility] Cardiac and Pulmonary Rehab  Education need identified 09/03/23       Infection Prevention: - Provides verbal and written material to individual with discussion of infection control including proper hand washing and proper equipment cleaning during exercise session. Flowsheet Row Cardiac Rehab from 09/03/2023 in [facility] Cardiac and Pulmonary Rehab  Date 09/03/23  Educator NT  Instruction Review Code 1- Verbalizes Understanding       Falls Prevention: - Provides verbal and written material to individual with discussion of falls prevention and safety. Flowsheet Row Cardiac Rehab from 09/03/2023 in [facility] Cardiac and Pulmonary Rehab  Date 09/03/23  Educator NT  Instruction Review Code 1- Verbalizes Understanding       Other: -Provides group and verbal instruction on various topics (see comments)   Knowledge Questionnaire Score:  Knowledge Questionnaire Score - 09/03/23 1030       Knowledge Questionnaire Score   Pre Score 23/26             Core Components/Risk Factors/Patient Goals  at Admission:  Personal Goals and Risk Factors at Admission - 08/29/23 1041       Core Components/Risk Factors/Patient Goals on Admission  Hypertension Yes    Intervention Provide education on lifestyle modifcations including regular physical activity/exercise, weight management, moderate sodium restriction and increased consumption of fresh fruit, vegetables, and low fat dairy, alcohol moderation, and smoking cessation.;Monitor prescription use compliance.    Expected Outcomes Short Term: Continued assessment and intervention until BP is < 140/80mm HG in hypertensive participants. < 130/61mm HG in hypertensive participants with diabetes, heart failure or chronic kidney disease.;Long Term: Maintenance of blood pressure at goal levels.    Lipids Yes    Intervention Provide education and support for participant on nutrition & aerobic/resistive exercise along with prescribed medications to achieve LDL 70mg , HDL >40mg .    Expected Outcomes Short Term: Participant states understanding of desired cholesterol values and is compliant with medications prescribed. Participant is following exercise prescription and nutrition guidelines.;Long Term: Cholesterol controlled with medications as prescribed, with individualized exercise RX and with personalized nutrition plan. Value goals: LDL < 70mg , HDL > 40 mg.             Education:Diabetes - Individual verbal and written instruction to review signs/symptoms of diabetes, desired ranges of glucose level fasting, after meals and with exercise. Acknowledge that pre and post exercise glucose checks will be done for 3 sessions at entry of program.   Core Components/Risk Factors/Patient Goals Review:   Goals and Risk Factor Review     Row Name 09/18/23 1327             Core Components/Risk Factors/Patient Goals Review   Personal Goals Review Hypertension;Lipids       Review Chelsea Greene reports doing well since starting the program. Chelsea Greene had been taking her  blood pressure regularly before starting the program and continues to do so. Her cholesterol medicine was new to her and Chelsea Greene thinks was initially making her not feel the best, but thinks it is starting to level out. Chelsea Greene is gaining more confidence in exercising on her own and might graduate early so Chelsea Greene can get back to her usual gym routine       Expected Outcomes Short: attend cardiac rehab for education and risk factor management skills Long: independently manage risk factors                Core Components/Risk Factors/Patient Goals at Discharge (Final Review):   Goals and Risk Factor Review - 09/18/23 1327       Core Components/Risk Factors/Patient Goals Review   Personal Goals Review Hypertension;Lipids    Review Chelsea Greene reports doing well since starting the program. Chelsea Greene had been taking her blood pressure regularly before starting the program and continues to do so. Her cholesterol medicine was new to her and Chelsea Greene thinks was initially making her not feel the best, but thinks it is starting to level out. Chelsea Greene is gaining more confidence in exercising on her own and might graduate early so Chelsea Greene can get back to her usual gym routine    Expected Outcomes Short: attend cardiac rehab for education and risk factor management skills Long: independently manage risk factors             ITP Comments:  ITP Comments     Row Name 08/29/23 1035 09/03/23 1030 09/04/23 1128 09/19/23 0818 09/25/23 1131   ITP Comments Initial phone call completed. Diagnosis can be found in [facility] 1/9. EP Orientation scheduled for Monday 1/27 at 9am. Completed and gym orientation. Initial ITP created and sent for review to Dr. Bethann Punches, Medical Director. First full day of exercise!  Patient  was oriented to gym and equipment including functions, settings, policies, and procedures.  Patient's individual exercise prescription and treatment plan were reviewed.  All starting workloads were established based on the results of  the 6 minute walk test done at initial orientation visit.  The plan for exercise progression was also introduced and progression will be customized based on patient's performance and goals. 30 Day review completed. Medical Director ITP review done, changes made as directed, and signed approval by Medical Director. New Patient Wilna graduated today from  rehab with 10 sessions completed.  Details of the patient's exercise prescription and what Chelsea Greene needs to do in order to continue the prescription and progress were discussed with patient.  Patient was given a copy of prescription and goals.  Patient verbalized understanding. Dianah plans to continue to exercise by exercising at home and gym..            Comments: Discharge ITP

## 2023-09-25 NOTE — Progress Notes (Signed)
Discharge Note for  Chelsea Greene     Apr 18, 1971         Chi graduated today from  rehab with 10 sessions completed.  Details of the patient's exercise prescription and what She needs to do in order to continue the prescription and progress were discussed with patient.  Patient was given a copy of prescription and goals.  Patient verbalized understanding. [name] plans to continue to exercise by exercising at home and gym..     6 Minute Walk     Row Name 09/03/23 1519 09/24/23 1557       6 Minute Walk   Phase Initial Discharge    Distance 1450 feet 1700 feet    Distance % Change -- 17 %    Distance Feet Change -- 250 ft    Walk Time 6 minutes 6 minutes    # of Rest Breaks 0 0    MPH 2.75 3.22    METS 4.19 4.72    RPE 9 11    Perceived Dyspnea  0 0    VO2 Peak 14.66 16.52    Symptoms No No    Resting HR 76 bpm 75 bpm    Resting BP 112/62 124/66    Resting Oxygen Saturation  100 % 98 %    Exercise Oxygen Saturation  during 6 min walk 99 % 99 %    Max Ex. HR 98 bpm 120 bpm    Max Ex. BP 132/70 118/70    2 Minute Post BP 120/66 --

## 2024-01-17 ENCOUNTER — Other Ambulatory Visit
Admission: RE | Admit: 2024-01-17 | Discharge: 2024-01-17 | Disposition: A | Discharge status: Home / Self Care | Payer: Self-pay | Source: Ambulatory Visit | Attending: Cardiology | Admitting: Cardiology

## 2024-01-17 DIAGNOSIS — R079 Chest pain, unspecified: Secondary | ICD-10-CM | POA: Diagnosis present

## 2024-01-17 LAB — TROPONIN I (HIGH SENSITIVITY): Troponin I (High Sensitivity): 7 ng/L (ref <18)
